# Patient Record
Sex: Female | Born: 1955 | Race: White | Hispanic: No | Marital: Married | Smoking: Never smoker
Health system: Southern US, Community
[De-identification: ages and names within clinical notes are randomized; demographics above are authoritative.]

## PROBLEM LIST (undated history)

## (undated) DIAGNOSIS — M1990 Unspecified osteoarthritis, unspecified site: Secondary | ICD-10-CM

## (undated) DIAGNOSIS — F329 Major depressive disorder, single episode, unspecified: Secondary | ICD-10-CM

## (undated) DIAGNOSIS — M797 Fibromyalgia: Secondary | ICD-10-CM

## (undated) DIAGNOSIS — F32A Depression, unspecified: Secondary | ICD-10-CM

## (undated) HISTORY — DX: Depression, unspecified: F32.A

## (undated) HISTORY — PX: BLADDER SURGERY: SHX569

## (undated) HISTORY — DX: Major depressive disorder, single episode, unspecified: F32.9

## (undated) HISTORY — PX: CHOLECYSTECTOMY: SHX55

## (undated) HISTORY — DX: Unspecified osteoarthritis, unspecified site: M19.90

## (undated) HISTORY — PX: ABDOMINAL HYSTERECTOMY: SHX81

## (undated) HISTORY — DX: Fibromyalgia: M79.7

## (undated) HISTORY — PX: OTHER SURGICAL HISTORY: SHX169

---

## 2012-11-20 DIAGNOSIS — M797 Fibromyalgia: Secondary | ICD-10-CM | POA: Insufficient documentation

## 2013-08-23 DIAGNOSIS — N3281 Overactive bladder: Secondary | ICD-10-CM | POA: Insufficient documentation

## 2013-08-29 DIAGNOSIS — D696 Thrombocytopenia, unspecified: Secondary | ICD-10-CM | POA: Insufficient documentation

## 2014-09-02 DIAGNOSIS — N390 Urinary tract infection, site not specified: Secondary | ICD-10-CM | POA: Insufficient documentation

## 2014-09-02 DIAGNOSIS — N952 Postmenopausal atrophic vaginitis: Secondary | ICD-10-CM | POA: Insufficient documentation

## 2015-10-21 DIAGNOSIS — F329 Major depressive disorder, single episode, unspecified: Secondary | ICD-10-CM | POA: Insufficient documentation

## 2015-10-21 DIAGNOSIS — F32A Depression, unspecified: Secondary | ICD-10-CM | POA: Insufficient documentation

## 2016-10-11 ENCOUNTER — Encounter: Payer: Self-pay | Admitting: Pain Medicine

## 2016-10-11 ENCOUNTER — Encounter (INDEPENDENT_AMBULATORY_CARE_PROVIDER_SITE_OTHER): Payer: Self-pay

## 2016-10-11 ENCOUNTER — Ambulatory Visit: Attending: Pain Medicine | Admitting: Pain Medicine

## 2016-10-11 VITALS — BP 124/77 | HR 88 | Temp 97.7°F | Resp 16 | Ht 67.0 in | Wt 171.0 lb

## 2016-10-11 DIAGNOSIS — M79605 Pain in left leg: Secondary | ICD-10-CM | POA: Diagnosis present

## 2016-10-11 DIAGNOSIS — F329 Major depressive disorder, single episode, unspecified: Secondary | ICD-10-CM | POA: Diagnosis not present

## 2016-10-11 DIAGNOSIS — G8929 Other chronic pain: Secondary | ICD-10-CM | POA: Diagnosis not present

## 2016-10-11 DIAGNOSIS — M5441 Lumbago with sciatica, right side: Secondary | ICD-10-CM | POA: Diagnosis not present

## 2016-10-11 DIAGNOSIS — Z79891 Long term (current) use of opiate analgesic: Secondary | ICD-10-CM | POA: Insufficient documentation

## 2016-10-11 DIAGNOSIS — F1190 Opioid use, unspecified, uncomplicated: Secondary | ICD-10-CM | POA: Diagnosis not present

## 2016-10-11 DIAGNOSIS — M5442 Lumbago with sciatica, left side: Secondary | ICD-10-CM | POA: Diagnosis not present

## 2016-10-11 DIAGNOSIS — M25552 Pain in left hip: Secondary | ICD-10-CM | POA: Diagnosis not present

## 2016-10-11 DIAGNOSIS — M79601 Pain in right arm: Secondary | ICD-10-CM | POA: Diagnosis not present

## 2016-10-11 DIAGNOSIS — M25511 Pain in right shoulder: Secondary | ICD-10-CM

## 2016-10-11 DIAGNOSIS — F419 Anxiety disorder, unspecified: Secondary | ICD-10-CM | POA: Insufficient documentation

## 2016-10-11 DIAGNOSIS — M79604 Pain in right leg: Secondary | ICD-10-CM | POA: Diagnosis present

## 2016-10-11 DIAGNOSIS — M797 Fibromyalgia: Secondary | ICD-10-CM | POA: Insufficient documentation

## 2016-10-11 DIAGNOSIS — R5383 Other fatigue: Secondary | ICD-10-CM | POA: Diagnosis not present

## 2016-10-11 DIAGNOSIS — M79602 Pain in left arm: Secondary | ICD-10-CM | POA: Diagnosis not present

## 2016-10-11 DIAGNOSIS — M533 Sacrococcygeal disorders, not elsewhere classified: Secondary | ICD-10-CM | POA: Insufficient documentation

## 2016-10-11 DIAGNOSIS — M25559 Pain in unspecified hip: Secondary | ICD-10-CM | POA: Diagnosis not present

## 2016-10-11 DIAGNOSIS — M47816 Spondylosis without myelopathy or radiculopathy, lumbar region: Secondary | ICD-10-CM

## 2016-10-11 DIAGNOSIS — G894 Chronic pain syndrome: Secondary | ICD-10-CM

## 2016-10-11 DIAGNOSIS — M4696 Unspecified inflammatory spondylopathy, lumbar region: Secondary | ICD-10-CM | POA: Diagnosis not present

## 2016-10-11 DIAGNOSIS — M25512 Pain in left shoulder: Secondary | ICD-10-CM

## 2016-10-11 DIAGNOSIS — M25551 Pain in right hip: Secondary | ICD-10-CM | POA: Insufficient documentation

## 2016-10-11 DIAGNOSIS — M545 Low back pain: Secondary | ICD-10-CM | POA: Insufficient documentation

## 2016-10-11 NOTE — Progress Notes (Signed)
Patient's Name: Sarah Morales  MRN: [id number]  Referring Provider: Radene Journey, MD  DOB: 1955-10-25  PCP: Radene Journey, MD  DOS: 10/11/2016  Note by: Kathlen Brunswick. Dossie Arbour, MD  Service setting: Ambulatory outpatient  Specialty: Interventional Pain Management  Location: [facility] (AMB) Pain Management Facility    Patient type: New Patient   Primary Reason(s) for Visit: Initial Patient Evaluation CC: Arm Pain (bilaterally); Leg Pain (bilaterally buttocks /down leg); and Fibromyalgia  HPI  Ms. Sarah Morales is a 61 y.o. year old, female patient, who comes today for an initial evaluation. She has Chronic pain syndrome; Long term (current) use of opiate analgesic; Long term prescription opiate use; Opiate use; Chronic upper extremity pain (Location of Primary Source of Pain) (Bilateral) (L>R); Chronic low back pain (Location of Secondary source of pain) (Bilateral) (R>L); Chronic shoulder pain (Location of Tertiary source of pain) (Bilateral) (R>L); Chronic sacroiliac joint pain (Right); Chronic hip pain (Bilateral) (R>L); and Lumbar facet syndrome (Bilateral) (R>L) on her problem list.. Her primarily concern today is the Arm Pain (bilaterally); Leg Pain (bilaterally buttocks /down leg); and Fibromyalgia  Pain Assessment: Self-Reported Pain Score: 2 /10             Reported level is compatible with observation.       Pain Type: Chronic pain Pain Location: Arm Pain Orientation: Right, Left Pain Descriptors / Indicators: Throbbing Pain Frequency: Constant  Onset and Duration: Date of onset: 11/17 Cause of pain: fibromyalgia Severity: No change since onset, NAS-11 at its worse: 5/10 and NAS-11 at its best: 1/10 Timing: After activity or exercise Aggravating Factors: Prolonged sitting, Prolonged standing, Walking and Working Alleviating Factors: Medications, Resting, Sleeping, Warm showers or baths, Walking and working Associated Problems: Depression, Fatigue, Inability to concentrate, Inability to control  bladder (urine), Personality changes, Temperature changes and Pain that does not allow patient to sleep Quality of Pain: Aching, Tender and Throbbing Previous Examinations or Tests: Bone scan and Chiropractic evaluation Previous Treatments: Chiropractic manipulations, Epidural steroid injections, Physical Therapy, Pool exercises, TENS and Trigger point injections  The patient comes into the clinics today for the first time for a chronic pain management evaluation. According to the patient primary area of pain is that of the upper extremities with the left being worst on the right. The patient indicates being right-handed. In the case of the left upper extremity, patient denies any surgeries, nerve blocks, joint injections, with the exception of an injection into her middle finger approximately 3 years ago. This was done by a [facility] rheumatologist. The patient denies any physical therapy or any recent x-rays. In the case of the right upper extremity the patient denies any prior surgeries, nerve blocks, joint injections, physical therapy, or x-rays. The next area of pain is described to be that of the lower back with the right side being worst on the left. The patient indicates having had surgery of the lower back around the year 2011 by Dr. Burman Riis (orthopedic surgeon) she also describes having had one nerve block done before the surgery at a pain clinic in [place]. She indicates that that particular nerve block did not help her pain. Prior to surgery the patient indicates that she was experiencing low back pain and lower extremity pain with the lower back pain being worst on the leg pain. In the case of the leg pain at that time the right side was worse than the left. In the case of the right leg pain would travel from the hip to the knee  through the lateral aspect of the leg. In the case of the left lower extremity, the pain also travels from the hip to the knee through the lateral aspect of the leg  but he never went down into her feet she describes that the pain completely went away after the surgery. The next area of pain is described to be the shoulders with the right side being worst on the left. In the case of the right shoulder she denies any prior surgeries but does admit to having had a joint injection done approximately 1 year ago at [facility]. She indicates that this injection did provide her with some benefit that was short lived. She denies any prior physical therapy or x-rays of the area. In the case of the left shoulder she denies any prior surgeries, nerve blocks, joint injections, physical therapy, or x-rays. The next area of pain is that of the SI joint with the right side being worst on the left. She indicates having had some nerve blocks done approximately 12-15 years ago by a rheumatologist. She indicates that the nerve blocks did help her pain for several weeks. She denies any surgeries but does admit to some physical therapy approximately 12-15 years ago consisting primarily on pool therapy. She denies any recent x-rays.  Physical exam today was positive for bilateral hip pain with the right side being worse than the left. Physical therapy was also positive for right-sided sacroiliac joint pain and bilateral lumbar facet pain with the right being worst on the left.  Today I took the time to provide the patient with information regarding my pain practice. The patient was informed that my practice is divided into two sections: an interventional pain management section, as well as a completely separate and distinct medication management section. I explained that I have procedure days for my interventional therapies, and evaluation days for follow-ups and medication management. Because of the amount of documentation required during both, they are kept separated. This means that there is the possibility that she may be scheduled for a procedure on one day, and medication  management the next. I have also informed her that because of staffing and facility limitations, I no longer take patients for medication management only. To illustrate the reasons for this, I gave the patient the example of surgeons, and how inappropriate it would be to refer a patient to his/her care, just to write for the post-surgical antibiotics on a surgery done by a different surgeon.   Because interventional pain management is my board-certified specialty, the patient was informed that joining my practice means that they are open to any and all interventional therapies. I made it clear that this does not mean that they will be forced to have any procedures done. What this means is that I believe interventional therapies to be essential part of the diagnosis and proper management of chronic pain conditions. Therefore, patients not interested in these interventional alternatives will be better served under the care of a different practitioner.  The patient was also made aware of my Comprehensive Pain Management Safety Guidelines where by joining my practice, they limit all of their nerve blocks and joint injections to those done by our practice, for as long as we are retained to manage their care.   Historic Controlled Substance Pharmacotherapy Review  PMP and historical list of controlled substances: Tramadol 50 mg; clonazepam 1 mg; Soma 250 mg; oxycodone/APAP 5/325; hydrocodone/APAP 10/325; hydrocodone cough suspension; Soma 350 mg; phentermine 37.5  mg. Highest analgesic regimen found: Oxycodone/APAP 5/325 one tablet by mouth every 4 hours (30 mg/day of oxycodone) (45 MME/Day) Most recent analgesic: Tramadol 50 mg 1 tablet by mouth twice a day Highest recorded MME/day: 45 mg/day MME/day: 10 mg/day Medications: The patient did not bring the medication(s) to the appointment, as requested in our "New Patient Package" Pharmacodynamics: Desired effects: Analgesia: The patient reports >50%  benefit. Reported improvement in function: The patient reports medication allows her to accomplish basic ADLs. Clinically meaningful improvement in function (CMIF): Sustained CMIF goals met Perceived effectiveness: Described as relatively effective, allowing for increase in activities of daily living (ADL) Undesirable effects: Side-effects or Adverse reactions: None reported Historical Monitoring: The patient  reports that she does not use drugs.. List of all UDS Test(s): No results found for: MDMA, COCAINSCRNUR, PCPSCRNUR, PCPQUANT, CANNABQUANT, THCU, [place] List of all Serum Drug Screening Test(s):  No results found for: AMPHSCRSER, BARBSCRSER, BENZOSCRSER, COCAINSCRSER, PCPSCRSER, PCPQUANT, THCSCRSER, CANNABQUANT, OPIATESCRSER, OXYSCRSER, PROPOXSCRSER Historical Background Evaluation: [place] PDMP: Six (6) year initial data search conducted.             [place] Department of public safety, offender search: Editor, commissioning Information) Non-contributory Risk Assessment Profile: Aberrant behavior: None observed or detected today Risk factors for fatal opioid overdose: None identified today Fatal overdose hazard ratio (HR): Calculation deferred Non-fatal overdose hazard ratio (HR): Calculation deferred Risk of opioid abuse or dependence: 0.7-3.0% with doses ? 36 MME/day and 6.1-26% with doses ? 120 MME/day. Substance use disorder (SUD) risk level: Pending results of Medical Psychology Evaluation for SUD Opioid risk tool (ORT) (Total Score): 1  ORT Scoring interpretation table:  Score <3 = Low Risk for SUD  Score between 4-7 = Moderate Risk for SUD  Score >8 = High Risk for Opioid Abuse   PHQ-2 Depression Scale:  Total score: 0  PHQ-2 Scoring interpretation table: (Score and probability of major depressive disorder)  Score 0 = No depression  Score 1 = 15.4% Probability  Score 2 = 21.1% Probability  Score 3 = 38.4% Probability  Score 4 = 45.5% Probability  Score 5 = 56.4% Probability  Score 6 = 78.6%  Probability   PHQ-9 Depression Scale:  Total score: 0  PHQ-9 Scoring interpretation table:  Score 0-4 = No depression  Score 5-9 = Mild depression  Score 10-14 = Moderate depression  Score 15-19 = Moderately severe depression  Score 20-27 = Severe depression (2.4 times higher risk of SUD and 2.89 times higher risk of overuse)   Pharmacologic Plan: Pending ordered tests and/or consults  Meds  The patient has a current medication list which includes the following prescription(s): celecoxib, duloxetine, duloxetine, gralise, and tramadol.  No current outpatient prescriptions on file prior to visit.   No current facility-administered medications on file prior to visit.    Imaging Review   Note: No results found under the [facility] record.        ROS  Cardiovascular History: Negative for hypertension, coronary artery diseas, myocardial infraction, anticoagulant therapy or heart failure Pulmonary or Respiratory History: Snoring  Neurological History: Negative for epilepsy, stroke, urinary or fecal inontinence, spina bifida or tethered cord syndrome Review of Past Neurological Studies: No results found for this or any previous visit. Psychological-Psychiatric History: Anxiety, Depression and Panic Attacks Gastrointestinal History: Reflux or heatburn Genitourinary History: Recurrent Urinary Tract infections Hematological History: Thrombocytopenia Endocrine History: Negative for diabetes or thyroid disease Rheumatologic History: Fibromyalgia Musculoskeletal History: Negative for myasthenia gravis, muscular dystrophy, multiple sclerosis or malignant  hyperthermia Work History: Retired  Allergies  Ms. Langelier is allergic to penicillins.  Laboratory Chemistry   Note: No results found under the [facility] electronic medical record  [facility]  Drug: Ms. Pollick  reports that she does not use drugs. Alcohol:  reports that she drinks alcohol. Tobacco:  reports  that she has never smoked. She has never used smokeless tobacco. Medical:  has a past medical history of Depression and Fibromyalgia. Family: family history is not on file.  Past Surgical History:  Procedure Laterality Date  . ABDOMINAL HYSTERECTOMY    . BLADDER SURGERY    . CHOLECYSTECTOMY    . prolapse colon     Active Ambulatory Problems    Diagnosis Date Noted  . Chronic pain syndrome 10/11/2016  . Long term (current) use of opiate analgesic 10/11/2016  . Long term prescription opiate use 10/11/2016  . Opiate use 10/11/2016  . Chronic upper extremity pain (Location of Primary Source of Pain) (Bilateral) (L>R) 10/11/2016  . Chronic low back pain (Location of Secondary source of pain) (Bilateral) (R>L) 10/11/2016  . Chronic shoulder pain (Location of Tertiary source of pain) (Bilateral) (R>L) 10/11/2016  . Chronic sacroiliac joint pain (Right) 10/11/2016  . Chronic hip pain (Bilateral) (R>L) 10/11/2016  . Lumbar facet syndrome (Bilateral) (R>L) 10/11/2016   Resolved Ambulatory Problems    Diagnosis Date Noted  . No Resolved Ambulatory Problems   Past Medical History:  Diagnosis Date  . Depression   . Fibromyalgia    Constitutional Exam  General appearance: Well nourished, well developed, and well hydrated. In no apparent acute distress Vitals:   10/11/16 1329  BP: 124/77  Pulse: 88  Resp: 16  Temp: 97.7 F (36.5 C)  SpO2: 100%  Weight: 171 lb (77.6 kg)  Height: 5' 7" (1.702 m)   BMI Assessment: Estimated body mass index is 26.78 kg/m as calculated from the following:   Height as of this encounter: 5' 7" (1.702 m).   Weight as of this encounter: 171 lb (77.6 kg).  BMI interpretation table: BMI level Category Range association with higher incidence of chronic pain  <18 kg/m2 Underweight   18.5-24.9 kg/m2 Ideal body weight   25-29.9 kg/m2 Overweight Increased incidence by 20%  30-34.9 kg/m2 Obese (Class I) Increased incidence by 68%  35-39.9 kg/m2 Severe  obesity (Class II) Increased incidence by 136%  >40 kg/m2 Extreme obesity (Class III) Increased incidence by 254%   BMI Readings from Last 4 Encounters:  10/11/16 26.78 kg/m   Wt Readings from Last 4 Encounters:  10/11/16 171 lb (77.6 kg)  Psych/Mental status: Alert, oriented x 3 (person, place, & time)       Eyes: PERLA Respiratory: No evidence of acute respiratory distress  Cervical Spine Exam  Inspection: No masses, redness, or swelling Alignment: Symmetrical Functional ROM: Unrestricted ROM      Stability: No instability detected Muscle strength & Tone: Functionally intact Sensory: Unimpaired Palpation: No palpable anomalies              Upper Extremity (UE) Exam    Side: Right upper extremity  Side: Left upper extremity  Inspection: No masses, redness, swelling, or asymmetry. No contractures  Inspection: No masses, redness, swelling, or asymmetry. No contractures  Functional ROM: Unrestricted ROM          Functional ROM: Unrestricted ROM          Muscle strength & Tone: Functionally intact  Muscle strength & Tone: Functionally intact  Sensory: Unimpaired  Sensory: Unimpaired  Palpation: No palpable anomalies              Palpation: No palpable anomalies              Specialized Test(s): Deferred         Specialized Test(s): Deferred          Thoracic Spine Exam  Inspection: No masses, redness, or swelling Alignment: Symmetrical Functional ROM: Unrestricted ROM Stability: No instability detected Sensory: Unimpaired Muscle strength & Tone: No palpable anomalies  Lumbar Spine Exam  Inspection: No masses, redness, or swelling Alignment: Symmetrical Functional ROM: Decreased ROM, bilaterally Stability: No instability detected Muscle strength & Tone: Functionally intact Sensory: Movement-associated pain Palpation: Complains of area being tender to palpation       Provocative Tests: Lumbar Hyperextension and rotation test: Positive bilaterally for facet joint  pain. Patrick's Maneuver: Positive for right-sided S-I arthralgia and for bilateral hip arthralgia  Gait & Posture Assessment  Ambulation: Unassisted Gait: Relatively normal for age and body habitus Posture: WNL   Lower Extremity Exam    Side: Right lower extremity  Side: Left lower extremity  Inspection: No masses, redness, swelling, or asymmetry. No contractures  Inspection: No masses, redness, swelling, or asymmetry. No contractures  Functional ROM: Unrestricted ROM          Functional ROM: Unrestricted ROM          Muscle strength & Tone: Able to Toe-walk & Heel-walk without problems  Muscle strength & Tone: Able to Toe-walk & Heel-walk without problems  Sensory: Unimpaired  Sensory: Unimpaired  Palpation: No palpable anomalies  Palpation: No palpable anomalies   Assessment  Primary Diagnosis & Pertinent Problem List: The primary encounter diagnosis was Chronic pain syndrome. Diagnoses of Chronic low back pain (Location of Secondary source of pain) (Bilateral) (R>L), Lumbar facet syndrome (Bilateral) (R>L), Chronic sacroiliac joint pain (Bilateral) (R>L), Chronic hip pain (Bilateral) (R>L), Chronic shoulder pain (Location of Tertiary source of pain) (Bilateral) (R>L), Chronic pain of both upper extremities, Long term (current) use of opiate analgesic, Long term prescription opiate use, and Opiate use were also pertinent to this visit.  Visit Diagnosis: 1. Chronic pain syndrome   2. Chronic low back pain (Location of Secondary source of pain) (Bilateral) (R>L)   3. Lumbar facet syndrome (Bilateral) (R>L)   4. Chronic sacroiliac joint pain (Bilateral) (R>L)   5. Chronic hip pain (Bilateral) (R>L)   6. Chronic shoulder pain (Location of Tertiary source of pain) (Bilateral) (R>L)   7. Chronic pain of both upper extremities   8. Long term (current) use of opiate analgesic   9. Long term prescription opiate use   10. Opiate use    Plan of Care  Initial treatment plan:  Please be  advised that as per protocol, today's visit has been an evaluation only. We have not taken over the patient's controlled substance management.  Problem-specific plan: No problem-specific Assessment & Plan notes found for this encounter.  Ordered Lab-work, Procedure(s), Referral(s), & Consult(s): Orders Placed This Encounter  Procedures  . DG Lumbar Spine Complete W/Bend  . DG Si Joints  . DG HIP UNILAT W OR W/O PELVIS 2-3 VIEWS LEFT  . DG HIP UNILAT W OR W/O PELVIS 2-3 VIEWS RIGHT  . DG Shoulder Left  . DG Shoulder Right  . Compliance Drug Analysis, Ur  . Comprehensive metabolic panel  . C-reactive protein  . Magnesium  . Sedimentation rate  . Vitamin B12  . 25-Hydroxyvitamin D Lcms D2+D3  . Ambulatory  referral to Psychology   Pharmacotherapy: Medications ordered:  No orders of the defined types were placed in this encounter.  Medications administered during this visit: Ms. Kathman had no medications administered during this visit.   Pharmacotherapy under consideration:  Opioid Analgesics: The patient was informed that there is no guarantee that she would be a candidate for opioid analgesics. The decision will be made following CDC guidelines. This decision will be based on the results of diagnostic studies, as well as Ms. Blomberg's risk profile.  Membrane stabilizer: To be determined at a later time Muscle relaxant: To be determined at a later time NSAID: To be determined at a later time Other analgesic(s): To be determined at a later time   Interventional therapies under consideration: Ms. Westbrook was informed that there is no guarantee that she would be a candidate for interventional therapies. The decision will be based on the results of diagnostic studies, as well as Ms. Interrante's risk profile.  Possible procedure(s): Possible diagnostic left-sided cervical epidural steroid injection  Diagnostic bilateral lumbar facet block  Possible bilateral lumbar facet RFA  Diagnostic  right-sided intra-articular sacroiliac joint injection  Possible right SI joint RFA  Diagnostic bilateral intra-articular hip joint injection  Possible bilateral femoral nerve and obturator nerve block  Possible bilateral femoral nerve and obturator nerve RFA    Provider-requested follow-up: Return for 2nd Visit, after Med[employer] eval.  No future appointments.  Primary Care Physician: Radene Journey, MD Location: [facility] Note by: Kathlen Brunswick. Dossie Arbour, M.D, DABA, DABAPM, DABPM, DABIPP, FIPP Date: 10/11/2016; Time: 2:43 PM  Patient instructions provided during this appointment: There are no Patient Instructions on file for this visit.

## 2016-10-16 LAB — COMPLIANCE DRUG ANALYSIS, UR

## 2016-10-25 ENCOUNTER — Ambulatory Visit
Admission: RE | Admit: 2016-10-25 | Discharge: 2016-10-25 | Disposition: A | Discharge status: Home / Self Care | Source: Ambulatory Visit | Attending: Pain Medicine | Admitting: Pain Medicine

## 2016-10-25 ENCOUNTER — Other Ambulatory Visit
Admission: RE | Admit: 2016-10-25 | Discharge: 2016-10-25 | Disposition: A | Discharge status: Home / Self Care | Source: Ambulatory Visit | Attending: Pain Medicine | Admitting: Pain Medicine

## 2016-10-25 DIAGNOSIS — M25512 Pain in left shoulder: Secondary | ICD-10-CM | POA: Diagnosis not present

## 2016-10-25 DIAGNOSIS — M5442 Lumbago with sciatica, left side: Secondary | ICD-10-CM | POA: Diagnosis not present

## 2016-10-25 DIAGNOSIS — M5136 Other intervertebral disc degeneration, lumbar region: Secondary | ICD-10-CM | POA: Diagnosis not present

## 2016-10-25 DIAGNOSIS — M4696 Unspecified inflammatory spondylopathy, lumbar region: Secondary | ICD-10-CM | POA: Insufficient documentation

## 2016-10-25 DIAGNOSIS — M19011 Primary osteoarthritis, right shoulder: Secondary | ICD-10-CM | POA: Insufficient documentation

## 2016-10-25 DIAGNOSIS — M25511 Pain in right shoulder: Secondary | ICD-10-CM

## 2016-10-25 DIAGNOSIS — M533 Sacrococcygeal disorders, not elsewhere classified: Secondary | ICD-10-CM | POA: Insufficient documentation

## 2016-10-25 DIAGNOSIS — M25552 Pain in left hip: Secondary | ICD-10-CM | POA: Diagnosis not present

## 2016-10-25 DIAGNOSIS — G8929 Other chronic pain: Secondary | ICD-10-CM | POA: Insufficient documentation

## 2016-10-25 DIAGNOSIS — M47816 Spondylosis without myelopathy or radiculopathy, lumbar region: Secondary | ICD-10-CM

## 2016-10-25 DIAGNOSIS — M25551 Pain in right hip: Secondary | ICD-10-CM | POA: Diagnosis not present

## 2016-10-25 DIAGNOSIS — M25559 Pain in unspecified hip: Secondary | ICD-10-CM

## 2016-10-25 DIAGNOSIS — M19012 Primary osteoarthritis, left shoulder: Secondary | ICD-10-CM | POA: Diagnosis not present

## 2016-10-25 DIAGNOSIS — M5441 Lumbago with sciatica, right side: Secondary | ICD-10-CM | POA: Insufficient documentation

## 2016-10-25 DIAGNOSIS — G894 Chronic pain syndrome: Secondary | ICD-10-CM

## 2016-10-25 LAB — COMPREHENSIVE METABOLIC PANEL
ALBUMIN: 4.4 g/dL (ref 3.5–5.0)
ALT: 20 U/L (ref 14–54)
AST: 23 U/L (ref 15–41)
Alkaline Phosphatase: 89 U/L (ref 38–126)
Anion gap: 8 (ref 5–15)
BUN: 20 mg/dL (ref 6–20)
CHLORIDE: 101 mmol/L (ref 101–111)
CO2: 29 mmol/L (ref 22–32)
Calcium: 9.3 mg/dL (ref 8.9–10.3)
Creatinine, Ser: 0.69 mg/dL (ref 0.44–1.00)
GFR calc Af Amer: >60 mL/min (ref >60)
Glucose, Bld: 96 mg/dL (ref 65–99)
POTASSIUM: 4.1 mmol/L (ref 3.5–5.1)
SODIUM: 138 mmol/L (ref 135–145)
Total Bilirubin: 0.6 mg/dL (ref 0.3–1.2)
Total Protein: 7 g/dL (ref 6.5–8.1)

## 2016-10-25 LAB — SEDIMENTATION RATE: Sed Rate: 2 mm/hr (ref 0–30)

## 2016-10-25 LAB — MAGNESIUM: Magnesium: 2 mg/dL (ref 1.7–2.4)

## 2016-10-25 LAB — VITAMIN B12: VITAMIN B 12: 318 pg/mL (ref 180–914)

## 2016-10-25 LAB — C-REACTIVE PROTEIN

## 2016-10-25 NOTE — Progress Notes (Signed)
Results were reviewed and found to be: mildly abnormal  No acute injury or pathology identified  Review would suggest interventional pain management techniques may be of benefit 

## 2016-10-26 NOTE — Progress Notes (Signed)
Results were reviewed and found to be: mildly abnormal  No acute injury or pathology identified  Review would suggest interventional pain management techniques may be of benefit 

## 2016-10-29 LAB — 25-HYDROXY VITAMIN D LCMS D2+D3
25-Hydroxy, Vitamin D-2: 2 ng/mL
25-Hydroxy, Vitamin D-3: 36 ng/mL
25-Hydroxy, Vitamin D: 38 ng/mL

## 2016-11-14 NOTE — Progress Notes (Signed)
Patient's Name: Sarah Morales  MRN: [id number]  Referring Provider: Radene Journey, MD  DOB: Jul 26, 1955  PCP: Radene Journey, MD  DOS: 11/15/2016  Note by: Kathlen Brunswick. Dossie Arbour, MD  Service setting: Ambulatory outpatient  Specialty: Interventional Pain Management  Location: [facility] (AMB) Pain Management Facility    Patient type: Established   Primary Reason(s) for Visit: Encounter for evaluation before starting new chronic pain management plan of care (Level of risk: moderate) CC: Arm Pain (fibromyalgia pain)  HPI  Sarah Morales is a 61 y.o. year old, female patient, who comes today for a follow-up evaluation to review the test results and decide on a treatment plan. She has Chronic pain syndrome; Long term (current) use of opiate analgesic; Long term prescription opiate use; Opiate use; Chronic upper extremity pain (Location of Primary Source of Pain) (Bilateral) (L>R); Chronic low back pain (Location of Secondary source of pain) (Bilateral) (R>L); Chronic shoulder pain (Location of Tertiary source of pain) (Bilateral) (R>L); Chronic sacroiliac joint pain (Right); Chronic hip pain (Bilateral) (R>L); Lumbar facet syndrome (Bilateral) (R>L); Depression; Fibromyalgia; OAB (overactive bladder); Recurrent UTI; Thrombocytopenia ([place]); Vaginal atrophy; and 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 Her primarily concern today is the Arm Pain (fibromyalgia pain)  Pain Assessment: Self-Reported Pain Score: 0-No pain/10             Reported level is compatible with observation.       Pain Type: Chronic pain Pain Orientation: Right, Left Pain Descriptors / Indicators: Aching, Dull, Throbbing Pain Frequency: Intermittent  Sarah Morales comes in today for a follow-up visit after her initial evaluation on 10/11/2016. Today we went over the results of her tests. These were explained in "Layman's terms". During today's appointment we went over my diagnostic impression, as well as the proposed treatment  plan.  In considering the treatment plan options, Ms. Palazzola was reminded that I no longer take patients for medication management only. I asked her to let me know if she had no intention of taking advantage of the interventional therapies, so that we could make arrangements to provide this space to someone interested. I also made it clear that undergoing interventional therapies for the purpose of getting pain medications is very inappropriate on the part of a patient, and it will not be tolerated in this practice. This type of behavior would suggest true addiction and therefore it requires referral to an addiction specialist.   Further details on both, my assessment(s), as well as the proposed treatment plan, please see below. Controlled Substance Pharmacotherapy Assessment REMS (Risk Evaluation and Mitigation Strategy)  Analgesic: Tramadol 50 mg 1 tablet by mouth twice a day Highest recorded MME/day: 45 mg/day MME/day: 10 mg/day Pill Count: None expected due to no prior prescriptions written by our practice. Pharmacokinetics: Liberation and absorption (onset of action): WNL Distribution (time to peak effect): WNL Metabolism and excretion (duration of action): WNL         Pharmacodynamics: Desired effects: Analgesia: Sarah Morales reports >50% benefit. Functional ability: Patient reports that medication allows her to accomplish basic ADLs Clinically meaningful improvement in function (CMIF): Sustained CMIF goals met Perceived effectiveness: Described as relatively effective, allowing for increase in activities of daily living (ADL) Undesirable effects: Side-effects or Adverse reactions: None reported Monitoring: [place] PMP: Online review of the past 83-monthperiod previously conducted. Not applicable at this point since we have not taken over the patient's medication management yet. List of all Serum Drug Screening Test(s):  No results found for: AMPHSCRSER, BARBSCRSER,  BENZOSCRSER,  COCAINSCRSER, PCPSCRSER, THCSCRSER, OPIATESCRSER, [place], [employer] List of all UDS test(s) done:  Lab Results  Component Value Date   SUMMARY FINAL 10/11/2016   Last UDS on record: Summary  Date Value Ref Range Status  10/11/2016 FINAL  Final    Comment:    ==================================================================== TOXASSURE COMP DRUG ANALYSIS,UR ==================================================================== Test                             Result       Flag       Units Drug Present and Declared for Prescription Verification   Tramadol                       PRESENT      EXPECTED   O-Desmethyltramadol            PRESENT      EXPECTED   N-Desmethyltramadol            PRESENT      EXPECTED    Source of tramadol is a prescription medication.    O-desmethyltramadol and N-desmethyltramadol are expected    metabolites of tramadol.   Gabapentin                     PRESENT      EXPECTED   Duloxetine                     PRESENT      EXPECTED Drug Present not Declared for Prescription Verification   Diphenhydramine                PRESENT      UNEXPECTED ==================================================================== Test                      Result    Flag   Units      Ref Range   Creatinine              32               mg/dL      >=20 ==================================================================== Declared Medications:  The flagging and interpretation on this report are based on the  following declared medications.  Unexpected results may arise from  inaccuracies in the declared medications.  **Note: The testing scope of this panel includes these medications:  Duloxetine (Cymbalta)  Gabapentin (Gralise)  Tramadol  **Note: The testing scope of this panel does not include following  reported medications:  Celecoxib (Celebrex) ==================================================================== For clinical consultation, please call [phone]. ====================================================================    UDS interpretation: No unexpected findings.          Medication Assessment Form: Patient introduced to form today Treatment compliance: Treatment may start today if patient agrees with proposed plan. Evaluation of compliance is not applicable at this point Risk Assessment Profile: Aberrant behavior: See initial evaluations. None observed or detected today Comorbid factors increasing risk of overdose: See initial evaluation. No additional risks detected today Risk Mitigation Strategies:  Patient opioid safety counseling: Completed today. Counseling provided to patient as per "Patient Counseling Document". Document signed by patient, attesting to counseling and understanding Patient-Prescriber Agreement (PPA): Obtained today  Controlled substance notification to other providers: Written and sent today  Pharmacologic Plan: Today we may be taking over the patient's pharmacological regimen. See below  Laboratory Chemistry  Inflammation Markers Lab Results  Component Value Date   CRP <0.8  10/25/2016   ESRSEDRATE 2 10/25/2016   (CRP: Acute Phase) (ESR: Chronic Phase) Renal Function Markers Lab Results  Component Value Date   BUN 20 10/25/2016   CREATININE 0.69 10/25/2016   GFRAA >60 10/25/2016   GFRNONAA >60 10/25/2016   Hepatic Function Markers Lab Results  Component Value Date   AST 23 10/25/2016   ALT 20 10/25/2016   ALBUMIN 4.4 10/25/2016   ALKPHOS 89 10/25/2016   Electrolytes Lab Results  Component Value Date   NA 138 10/25/2016   K 4.1 10/25/2016   CL 101 10/25/2016   CALCIUM 9.3 10/25/2016   MG 2.0 10/25/2016   Neuropathy Markers Lab Results  Component Value Date   VITAMINB12 318 10/25/2016   Bone Pathology Markers Lab Results  Component Value Date   ALKPHOS 89 10/25/2016   25OHVITD1 38 10/25/2016   25OHVITD2 2.0 10/25/2016   25OHVITD3 36 10/25/2016   CALCIUM 9.3 10/25/2016    Coagulation Parameters No results found for: INR, LABPROT, APTT, PLT Cardiovascular Markers No results found for: BNP, HGB, HCT Note: Lab results reviewed.  Recent Diagnostic Imaging Review  Dg Lumbar Spine Complete W/bend Result Date: 10/25/2016 CLINICAL DATA:  Chronic low back pain EXAM: LUMBAR SPINE - COMPLETE WITH BENDING VIEWS COMPARISON:  None FINDINGS: Changes of prior posterior fusion at L4-5. No hardware complicating feature. Mild leftward scoliosis in the mid lumbar spine. Degenerative disc disease changes, most pronounced at L3-4 with sclerotic endplates and spurring. No fracture. SI joints are symmetric and unremarkable. No instability with flexion or extension. IMPRESSION: Degenerative and postoperative changes in the lumbar spine. No acute findings. Electronically Signed   By: Rolm Baptise M.D.   On: 10/25/2016 15:33   Dg Si Joints Result Date: 10/25/2016 CLINICAL DATA:  Chronic SI joint pain EXAM: BILATERAL SACROILIAC JOINTS - 3+ VIEW COMPARISON:  None. FINDINGS: Mild sclerosis around the left SI joint could reflect sacroiliitis. Right SI joint is unremarkable. No acute bony abnormality. IMPRESSION: Mild sclerosis around the left SI joint may reflect mild sacroiliitis. Electronically Signed   By: Rolm Baptise M.D.   On: 10/25/2016 15:33   Dg Shoulder Right Result Date: 10/25/2016 CLINICAL DATA:  Chronic bilateral shoulder pain. EXAM: RIGHT SHOULDER - 2+ VIEW COMPARISON:  None in PACs FINDINGS: The bones are subjectively adequately mineralized. The glenohumeral joint space is well maintained. Small spurs arise from the inferior articular margin of the humeral head and the adjacent inferior glenoid lip. The subacromial subdeltoid space is normal. The AC joint space is reasonably well-maintained. There is no acute or old fracture or dislocation. IMPRESSION: Mild osteoarthritic change centered on the glenohumeral joint. No acute bony abnormality. Electronically Signed   By: David  Martinique  M.D.   On: 10/25/2016 15:32   Dg Shoulder Left Result Date: 10/25/2016 CLINICAL DATA:  Chronic bilateral shoulder pain. EXAM: LEFT SHOULDER - 2+ VIEW COMPARISON:  None in PACs FINDINGS: The bones are subjectively adequately mineralized. The glenohumeral joint spaces reasonably well-maintained. A small spur arises from the inferior articular margin of the glenoid. The subacromial subdeltoid space is normal. There is mild narrowing of the AC joint. IMPRESSION: Very mild osteoarthritic change of the left shoulder. No acute bony abnormality. Electronically Signed   By: David  Martinique M.D.   On: 10/25/2016 15:31   Dg Hip Unilat W Or W/o Pelvis 2-3 Views Left Result Date: 10/25/2016 CLINICAL DATA:  Chronic bilateral hip pain EXAM: DG HIP (WITH OR WITHOUT PELVIS) 2-3V LEFT; DG HIP (WITH OR WITHOUT PELVIS) 2-3V RIGHT  COMPARISON:  None in PACs FINDINGS: The bones are subjectively osteopenic. There is no lytic or blastic lesion. No acute or old fracture is observed. The hip joint spaces are well maintained. The articular surfaces of the femoral heads and acetabuli remains smoothly rounded. The femoral necks, intertrochanteric, immediate sub trochanteric regions are normal. IMPRESSION: There is no acute or significant chronic bony abnormality of the hips. Electronically Signed   By: David  Martinique M.D.   On: 10/25/2016 15:30   Dg Hip Unilat W Or W/o Pelvis 2-3 Views Right Result Date: 10/25/2016 CLINICAL DATA:  Chronic bilateral hip pain EXAM: DG HIP (WITH OR WITHOUT PELVIS) 2-3V LEFT; DG HIP (WITH OR WITHOUT PELVIS) 2-3V RIGHT COMPARISON:  None in PACs FINDINGS: The bones are subjectively osteopenic. There is no lytic or blastic lesion. No acute or old fracture is observed. The hip joint spaces are well maintained. The articular surfaces of the femoral heads and acetabuli remains smoothly rounded. The femoral necks, intertrochanteric, immediate sub trochanteric regions are normal. IMPRESSION: There is no acute or  significant chronic bony abnormality of the hips. Electronically Signed   By: David  Martinique M.D.   On: 10/25/2016 15:30   Shoulder Imaging: Shoulder-R DG:  Results for orders placed during the hospital encounter of 10/25/16  DG Shoulder Right   Narrative CLINICAL DATA:  Chronic bilateral shoulder pain.  EXAM: RIGHT SHOULDER - 2+ VIEW  COMPARISON:  None in PACs  FINDINGS: The bones are subjectively adequately mineralized. The glenohumeral joint space is well maintained. Small spurs arise from the inferior articular margin of the humeral head and the adjacent inferior glenoid lip. The subacromial subdeltoid space is normal. The AC joint space is reasonably well-maintained. There is no acute or old fracture or dislocation.  IMPRESSION: Mild osteoarthritic change centered on the glenohumeral joint. No acute bony abnormality.   Electronically Signed   By: David  Martinique M.D.   On: 10/25/2016 15:32    Shoulder-L DG:  Results for orders placed during the hospital encounter of 10/25/16  DG Shoulder Left   Narrative CLINICAL DATA:  Chronic bilateral shoulder pain.  EXAM: LEFT SHOULDER - 2+ VIEW  COMPARISON:  None in PACs  FINDINGS: The bones are subjectively adequately mineralized. The glenohumeral joint spaces reasonably well-maintained. A small spur arises from the inferior articular margin of the glenoid. The subacromial subdeltoid space is normal. There is mild narrowing of the AC joint.  IMPRESSION: Very mild osteoarthritic change of the left shoulder. No acute bony abnormality.   Electronically Signed   By: David  Martinique M.D.   On: 10/25/2016 15:31    Lumbosacral Imaging: Lumbar DG Bending views:  Results for orders placed during the hospital encounter of 10/25/16  DG Lumbar Spine Complete W/Bend   Narrative CLINICAL DATA:  Chronic low back pain  EXAM: LUMBAR SPINE - COMPLETE WITH BENDING VIEWS  COMPARISON:  None  FINDINGS: Changes of prior posterior  fusion at L4-5. No hardware complicating feature. Mild leftward scoliosis in the mid lumbar spine. Degenerative disc disease changes, most pronounced at L3-4 with sclerotic endplates and spurring. No fracture. SI joints are symmetric and unremarkable. No instability with flexion or extension.  IMPRESSION: Degenerative and postoperative changes in the lumbar spine. No acute findings.   Electronically Signed   By: Rolm Baptise M.D.   On: 10/25/2016 15:33    Sacroiliac Joint Imaging: Sacroiliac Joint DG:  Results for orders placed during the hospital encounter of 10/25/16  DG Si Joints   Narrative CLINICAL DATA:  Chronic SI joint pain  EXAM: BILATERAL SACROILIAC JOINTS - 3+ VIEW  COMPARISON:  None.  FINDINGS: Mild sclerosis around the left SI joint could reflect sacroiliitis. Right SI joint is unremarkable. No acute bony abnormality.  IMPRESSION: Mild sclerosis around the left SI joint may reflect mild sacroiliitis.   Electronically Signed   By: Rolm Baptise M.D.   On: 10/25/2016 15:33    Hip Imaging: Hip-R DG 2-3 views:  Results for orders placed during the hospital encounter of 10/25/16  DG HIP UNILAT W OR W/O PELVIS 2-3 VIEWS RIGHT   Narrative CLINICAL DATA:  Chronic bilateral hip pain  EXAM: DG HIP (WITH OR WITHOUT PELVIS) 2-3V LEFT; DG HIP (WITH OR WITHOUT PELVIS) 2-3V RIGHT  COMPARISON:  None in PACs  FINDINGS: The bones are subjectively osteopenic. There is no lytic or blastic lesion. No acute or old fracture is observed. The hip joint spaces are well maintained. The articular surfaces of the femoral heads and acetabuli remains smoothly rounded. The femoral necks, intertrochanteric, immediate sub trochanteric regions are normal.  IMPRESSION: There is no acute or significant chronic bony abnormality of the hips.   Electronically Signed   By: David  Martinique M.D.   On: 10/25/2016 15:30    Hip-L DG 2-3 views:  Results for orders placed during the  hospital encounter of 10/25/16  DG HIP UNILAT W OR W/O PELVIS 2-3 VIEWS LEFT   Narrative CLINICAL DATA:  Chronic bilateral hip pain  EXAM: DG HIP (WITH OR WITHOUT PELVIS) 2-3V LEFT; DG HIP (WITH OR WITHOUT PELVIS) 2-3V RIGHT  COMPARISON:  None in PACs  FINDINGS: The bones are subjectively osteopenic. There is no lytic or blastic lesion. No acute or old fracture is observed. The hip joint spaces are well maintained. The articular surfaces of the femoral heads and acetabuli remains smoothly rounded. The femoral necks, intertrochanteric, immediate sub trochanteric regions are normal.  IMPRESSION: There is no acute or significant chronic bony abnormality of the hips.   Electronically Signed   By: David  Martinique M.D.   On: 10/25/2016 15:30    Note: Results of ordered imaging test(s) reviewed and explained to patient in Layman's terms. Copy of results provided to patient  Meds  The patient has a current medication list which includes the following prescription(s): celecoxib, duloxetine, duloxetine, gralise, and tramadol.  No current outpatient prescriptions on file prior to visit.   No current facility-administered medications on file prior to visit.    ROS  Constitutional: Denies any fever or chills Gastrointestinal: No reported hemesis, hematochezia, vomiting, or acute GI distress Musculoskeletal: Denies any acute onset joint swelling, redness, loss of ROM, or weakness Neurological: No reported episodes of acute onset apraxia, aphasia, dysarthria, agnosia, amnesia, paralysis, loss of coordination, or loss of consciousness  Allergies  Ms. Pavone is allergic to penicillins.  [place]  Drug: Ms. Mowers  reports that she does not use drugs. Alcohol:  reports that she drinks alcohol. Tobacco:  reports that she has never smoked. She has never used smokeless tobacco. Medical:  has a past medical history of Depression and Fibromyalgia. Family: family history is not on file.  Past  Surgical History:  Procedure Laterality Date  . ABDOMINAL HYSTERECTOMY    . BLADDER SURGERY    . CHOLECYSTECTOMY    . prolapse colon     Constitutional Exam  General appearance: Well nourished, well developed, and well hydrated. In no apparent acute distress Vitals:   11/15/16 0845  BP: 121/84  Pulse: 79  Resp:  14  Temp: 98.4 F (36.9 C)  TempSrc: Oral  SpO2: 100%  Weight: 174 lb (78.9 kg)  Height: 5' 6"  (1.676 m)   BMI Assessment: Estimated body mass index is 28.08 kg/m as calculated from the following:   Height as of this encounter: 5' 6"  (1.676 m).   Weight as of this encounter: 174 lb (78.9 kg).  BMI interpretation table: BMI level Category Range association with higher incidence of chronic pain  <18 kg/m2 Underweight   18.5-24.9 kg/m2 Ideal body weight   25-29.9 kg/m2 Overweight Increased incidence by 20%  30-34.9 kg/m2 Obese (Class I) Increased incidence by 68%  35-39.9 kg/m2 Severe obesity (Class II) Increased incidence by 136%  >40 kg/m2 Extreme obesity (Class III) Increased incidence by 254%   BMI Readings from Last 4 Encounters:  11/15/16 28.08 kg/m  10/11/16 26.78 kg/m   Wt Readings from Last 4 Encounters:  11/15/16 174 lb (78.9 kg)  10/11/16 171 lb (77.6 kg)  Psych/Mental status: Alert, oriented x 3 (person, place, & time)       Eyes: PERLA Respiratory: No evidence of acute respiratory distress  Cervical Spine Exam  Inspection: No masses, redness, or swelling Alignment: Symmetrical Functional ROM: Unrestricted ROM      Stability: No instability detected Muscle strength & Tone: Functionally intact Sensory: Unimpaired Palpation: No palpable anomalies              Upper Extremity (UE) Exam    Side: Right upper extremity  Side: Left upper extremity  Inspection: No masses, redness, swelling, or asymmetry. No contractures  Inspection: No masses, redness, swelling, or asymmetry. No contractures  Functional ROM: Unrestricted ROM          Functional ROM:  Unrestricted ROM          Muscle strength & Tone: Functionally intact  Muscle strength & Tone: Functionally intact  Sensory: Unimpaired  Sensory: Unimpaired  Palpation: No palpable anomalies              Palpation: No palpable anomalies              Specialized Test(s): Deferred         Specialized Test(s): Deferred          Thoracic Spine Exam  Inspection: No masses, redness, or swelling Alignment: Symmetrical Functional ROM: Unrestricted ROM Stability: No instability detected Sensory: Unimpaired Muscle strength & Tone: No palpable anomalies  Lumbar Spine Exam  Inspection: No masses, redness, or swelling Alignment: Symmetrical Functional ROM: Unrestricted ROM      Stability: No instability detected Muscle strength & Tone: Functionally intact Sensory: Unimpaired Palpation: No palpable anomalies       Provocative Tests: Lumbar Hyperextension and rotation test: evaluation deferred today       Patrick's Maneuver: evaluation deferred today                    Gait & Posture Assessment  Ambulation: Unassisted Gait: Relatively normal for age and body habitus Posture: WNL   Lower Extremity Exam    Side: Right lower extremity  Side: Left lower extremity  Inspection: No masses, redness, swelling, or asymmetry. No contractures  Inspection: No masses, redness, swelling, or asymmetry. No contractures  Functional ROM: Unrestricted ROM          Functional ROM: Unrestricted ROM          Muscle strength & Tone: Functionally intact  Muscle strength & Tone: Functionally intact  Sensory: Unimpaired  Sensory: Unimpaired  Palpation: No  palpable anomalies  Palpation: No palpable anomalies   Assessment & Plan  Primary Diagnosis & Pertinent Problem List: The primary encounter diagnosis was Chronic upper extremity pain (Location of Primary Source of Pain) (Bilateral) (L>R). 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  Visit Diagnosis: 1. Chronic upper extremity pain (Location of Primary Source of Pain) (Bilateral) (L>R)   2. Chronic low back pain (Location of Secondary source of pain) (Bilateral) (R>L)   3. Chronic shoulder pain (Location of Tertiary source of pain) (Bilateral) (R>L)   4. 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 Lumbar facet syndrome (Bilateral) (R>L)   6. Chronic sacroiliac joint pain (Right)   7. Chronic hip pain (Bilateral) (R>L)   8. Chronic pain syndrome   9. Long term prescription opiate use   10. Opiate use   11. Fibromyalgia    Problems updated and reviewed during this visit: No problems updated. Problem-specific Plan(s): No problem-specific Assessment & Plan notes found for this encounter.  Assessment & plan notes cannot be loaded without a specified hospital service.  Plan of Care  Pharmacotherapy (Medications Ordered): Meds ordered this encounter  Medications  . celecoxib (CELEBREX) 100 MG capsule    Sig: Take 1 capsule (100 mg total) by mouth 2 (two) times daily.    Dispense:  60 capsule    Refill:  2    Do not place medication on "Automatic Refill". Fill one day early if pharmacy is closed on scheduled refill date.  Marland Kitchen GRALISE 600 MG TABS    Sig: Take 600 mg by mouth at bedtime.    Dispense:  30 tablet    Refill:  2    Do not place medication on "Automatic Refill". Fill one day early if pharmacy is closed on scheduled refill date.  . traMADol (ULTRAM) 50 MG tablet    Sig: Take 1 tablet (50 mg total) by mouth 2 (two) times daily.    Dispense:  60 tablet    Refill:  2    Fill one day early if pharmacy is closed on scheduled refill  date. Do not fill until: 11/15/16 To last until: 02/13/17  . DULoxetine (CYMBALTA) 60 MG capsule    Sig: Take 1 capsule (60 mg total) by mouth daily.    Dispense:  30 capsule    Refill:  2    Do not place medication on "Automatic Refill". Fill one day early if pharmacy is closed on scheduled refill date.  . DULoxetine (CYMBALTA) 30 MG capsule    Sig: Take 1 capsule (30 mg total) by mouth daily.    Dispense:  30 capsule    Refill:  2    Do not place medication on "Automatic Refill". Fill one day early if pharmacy is closed on scheduled refill date.   Lab-work, procedure(s), and/or referral(s): Orders Placed This Encounter  Procedures  . SHOULDER INJECTION  . SHOULDER INJECTION    Pharmacotherapy: Opioid Analgesics: We'll take over management today. See above orders Membrane stabilizer: We have discussed the possibility of optimizing this mode of therapy, if tolerated Muscle relaxant: We have discussed the possibility of a trial NSAID: We have discussed the possibility of a  trial Other analgesic(s): To be determined at a later time   Interventional therapies: Planned, scheduled, and/or pending:    Diagnostic bilateral intra-articular shoulder joint injection under fluoroscopic guidance    Considering:   Possible diagnostic left-sided cervical epidural steroid injection  Diagnostic bilateral lumbar facet block  Possible bilateral lumbar facet RFA  Diagnostic right-sided intra-articular sacroiliac joint injection  Possible right SI joint RFA  Diagnostic bilateral intra-articular hip joint injection  Possible bilateral femoral nerve and obturator nerve block  Possible bilateral femoral nerve and obturator nerve RFA   PRN Procedures:   Diagnostic bilateral intra-articular shoulder joint injection    Provider-requested follow-up: Return in about 3 months (around 02/15/2017) for Med-Mgmt, w/ NP, in addition, PRN procedure(s), by MD.  Future Appointments Date Time Provider  [place]  11/23/2016 2:00 PM Milinda Pointer, MD [facility] None  02/15/2017 8:45 AM Vevelyn Francois, NP [facility] None    Primary Care Physician: Radene Journey, MD Location: [facility] Management Facility Note by: Kathlen Brunswick. Dossie Arbour, M.D, DABA, DABAPM, DABPM, DABIPP, FIPP Date: 11/15/2016; Time: 10:25 AM  Patient instructions provided during this appointment: Patient Instructions  You were given one prescription for Tramadol today. Prescriptions for Celebrex, Cymbalta 60 mg and 30 mg, and Gralise.  When you come for your shoulder injection, do not eat or drink 3 hours before your appointment.

## 2016-11-15 ENCOUNTER — Encounter: Payer: Self-pay | Admitting: Pain Medicine

## 2016-11-15 ENCOUNTER — Ambulatory Visit: Attending: Pain Medicine | Admitting: Pain Medicine

## 2016-11-15 VITALS — BP 121/84 | HR 79 | Temp 98.4°F | Resp 14 | Ht 66.0 in | Wt 174.0 lb

## 2016-11-15 DIAGNOSIS — M79602 Pain in left arm: Secondary | ICD-10-CM | POA: Insufficient documentation

## 2016-11-15 DIAGNOSIS — M25552 Pain in left hip: Secondary | ICD-10-CM | POA: Diagnosis not present

## 2016-11-15 DIAGNOSIS — M25551 Pain in right hip: Secondary | ICD-10-CM | POA: Diagnosis not present

## 2016-11-15 DIAGNOSIS — M5441 Lumbago with sciatica, right side: Secondary | ICD-10-CM

## 2016-11-15 DIAGNOSIS — M4696 Unspecified inflammatory spondylopathy, lumbar region: Secondary | ICD-10-CM | POA: Diagnosis not present

## 2016-11-15 DIAGNOSIS — M25559 Pain in unspecified hip: Secondary | ICD-10-CM

## 2016-11-15 DIAGNOSIS — Z981 Arthrodesis status: Secondary | ICD-10-CM | POA: Insufficient documentation

## 2016-11-15 DIAGNOSIS — M5442 Lumbago with sciatica, left side: Secondary | ICD-10-CM

## 2016-11-15 DIAGNOSIS — M797 Fibromyalgia: Secondary | ICD-10-CM | POA: Insufficient documentation

## 2016-11-15 DIAGNOSIS — N3281 Overactive bladder: Secondary | ICD-10-CM | POA: Insufficient documentation

## 2016-11-15 DIAGNOSIS — M19011 Primary osteoarthritis, right shoulder: Secondary | ICD-10-CM | POA: Diagnosis not present

## 2016-11-15 DIAGNOSIS — M533 Sacrococcygeal disorders, not elsewhere classified: Secondary | ICD-10-CM | POA: Insufficient documentation

## 2016-11-15 DIAGNOSIS — Z79891 Long term (current) use of opiate analgesic: Secondary | ICD-10-CM | POA: Diagnosis not present

## 2016-11-15 DIAGNOSIS — M79601 Pain in right arm: Secondary | ICD-10-CM | POA: Insufficient documentation

## 2016-11-15 DIAGNOSIS — M19012 Primary osteoarthritis, left shoulder: Secondary | ICD-10-CM | POA: Diagnosis not present

## 2016-11-15 DIAGNOSIS — F1190 Opioid use, unspecified, uncomplicated: Secondary | ICD-10-CM | POA: Diagnosis not present

## 2016-11-15 DIAGNOSIS — F329 Major depressive disorder, single episode, unspecified: Secondary | ICD-10-CM | POA: Insufficient documentation

## 2016-11-15 DIAGNOSIS — M25511 Pain in right shoulder: Secondary | ICD-10-CM

## 2016-11-15 DIAGNOSIS — M47816 Spondylosis without myelopathy or radiculopathy, lumbar region: Secondary | ICD-10-CM

## 2016-11-15 DIAGNOSIS — Z87440 Personal history of urinary (tract) infections: Secondary | ICD-10-CM | POA: Insufficient documentation

## 2016-11-15 DIAGNOSIS — M25512 Pain in left shoulder: Secondary | ICD-10-CM

## 2016-11-15 DIAGNOSIS — G894 Chronic pain syndrome: Secondary | ICD-10-CM | POA: Insufficient documentation

## 2016-11-15 DIAGNOSIS — D696 Thrombocytopenia, unspecified: Secondary | ICD-10-CM | POA: Insufficient documentation

## 2016-11-15 DIAGNOSIS — G8929 Other chronic pain: Secondary | ICD-10-CM

## 2016-11-15 MED ORDER — DULOXETINE HCL 30 MG PO CPEP: 30.0000 mg | ORAL_CAPSULE | Freq: Every day | ORAL | 2 refills | Status: DC

## 2016-11-15 MED ORDER — CELECOXIB 100 MG PO CAPS: 100.0000 mg | ORAL_CAPSULE | Freq: Two times a day (BID) | ORAL | 2 refills | Status: AC

## 2016-11-15 MED ORDER — DULOXETINE HCL 60 MG PO CPEP: 60.0000 mg | ORAL_CAPSULE | Freq: Every day | ORAL | 2 refills | Status: AC

## 2016-11-15 MED ORDER — GRALISE 600 MG PO TABS: 600.0000 mg | ORAL_TABLET | Freq: Every day | ORAL | 2 refills | Status: AC

## 2016-11-15 MED ORDER — TRAMADOL HCL 50 MG PO TABS: 50.0000 mg | ORAL_TABLET | Freq: Two times a day (BID) | ORAL | 2 refills | Status: AC

## 2016-11-15 NOTE — Progress Notes (Signed)
Safety precautions to be maintained throughout the outpatient stay will include: orient to surroundings, keep bed in low position, maintain call bell within reach at all times, provide assistance with transfer out of bed and ambulation.  

## 2016-11-15 NOTE — Patient Instructions (Addendum)
You were given one prescription for Tramadol today. Prescriptions for Celebrex, Cymbalta 60 mg and 30 mg, and Gralise.  When you come for your shoulder injection, do not eat or drink 3 hours before your appointment.

## 2016-11-23 ENCOUNTER — Ambulatory Visit (HOSPITAL_BASED_OUTPATIENT_CLINIC_OR_DEPARTMENT_OTHER): Admitting: Pain Medicine

## 2016-11-23 ENCOUNTER — Encounter: Payer: Self-pay | Admitting: Pain Medicine

## 2016-11-23 ENCOUNTER — Ambulatory Visit
Admission: RE | Admit: 2016-11-23 | Discharge: 2016-11-23 | Disposition: A | Discharge status: Home / Self Care | Source: Ambulatory Visit | Attending: Pain Medicine | Admitting: Pain Medicine

## 2016-11-23 VITALS — BP 118/92 | HR 72 | Temp 97.5°F | Resp 12 | Ht 66.0 in | Wt 174.0 lb

## 2016-11-23 DIAGNOSIS — Z90710 Acquired absence of both cervix and uterus: Secondary | ICD-10-CM | POA: Diagnosis not present

## 2016-11-23 DIAGNOSIS — M5412 Radiculopathy, cervical region: Secondary | ICD-10-CM | POA: Diagnosis not present

## 2016-11-23 DIAGNOSIS — G8929 Other chronic pain: Secondary | ICD-10-CM | POA: Insufficient documentation

## 2016-11-23 DIAGNOSIS — M542 Cervicalgia: Secondary | ICD-10-CM

## 2016-11-23 DIAGNOSIS — Z880 Allergy status to penicillin: Secondary | ICD-10-CM | POA: Diagnosis not present

## 2016-11-23 DIAGNOSIS — M79602 Pain in left arm: Secondary | ICD-10-CM | POA: Diagnosis not present

## 2016-11-23 DIAGNOSIS — M25512 Pain in left shoulder: Secondary | ICD-10-CM | POA: Diagnosis not present

## 2016-11-23 DIAGNOSIS — M25511 Pain in right shoulder: Secondary | ICD-10-CM

## 2016-11-23 DIAGNOSIS — M79601 Pain in right arm: Secondary | ICD-10-CM | POA: Diagnosis not present

## 2016-11-23 DIAGNOSIS — Z9049 Acquired absence of other specified parts of digestive tract: Secondary | ICD-10-CM | POA: Insufficient documentation

## 2016-11-23 MED ORDER — LIDOCAINE HCL (PF) 1 % IJ SOLN
INTRAMUSCULAR | Status: AC
  Filled 2016-11-23: qty 5

## 2016-11-23 MED ORDER — IOPAMIDOL (ISOVUE-M 200) INJECTION 41%
10.0000 mL | Freq: Once | INTRAMUSCULAR | Status: AC
  Administered 2016-11-23: 10 mL via EPIDURAL

## 2016-11-23 MED ORDER — ROPIVACAINE HCL 2 MG/ML IJ SOLN
1.0000 mL | Freq: Once | INTRAMUSCULAR | Status: AC
Start: 2016-11-23 — End: 2016-11-23
  Administered 2016-11-23: 10 mL via EPIDURAL

## 2016-11-23 MED ORDER — IOPAMIDOL (ISOVUE-M 200) INJECTION 41%
INTRAMUSCULAR | Status: AC
  Filled 2016-11-23: qty 10

## 2016-11-23 MED ORDER — ROPIVACAINE HCL 2 MG/ML IJ SOLN
INTRAMUSCULAR | Status: AC
  Filled 2016-11-23: qty 10

## 2016-11-23 MED ORDER — LIDOCAINE HCL (PF) 1.5 % IJ SOLN: 20.0000 mL | Freq: Once | INTRAMUSCULAR | Status: DC

## 2016-11-23 MED ORDER — DEXAMETHASONE SODIUM PHOSPHATE 10 MG/ML IJ SOLN
10.0000 mg | Freq: Once | INTRAMUSCULAR | Status: AC
  Administered 2016-11-23: 10 mg

## 2016-11-23 MED ORDER — LIDOCAINE HCL (PF) 1 % IJ SOLN: 5.0000 mL | Freq: Once | INTRAMUSCULAR | Status: DC

## 2016-11-23 MED ORDER — SODIUM CHLORIDE 0.9% FLUSH: 1.0000 mL | Freq: Once | INTRAVENOUS | Status: DC

## 2016-11-23 MED ORDER — DEXAMETHASONE SODIUM PHOSPHATE 10 MG/ML IJ SOLN
INTRAMUSCULAR | Status: AC
  Filled 2016-11-23: qty 1

## 2016-11-23 MED ORDER — SODIUM CHLORIDE 0.9 % IJ SOLN
INTRAMUSCULAR | Status: AC
  Filled 2016-11-23: qty 10

## 2016-11-23 NOTE — Patient Instructions (Addendum)
____________________________________________________________________________________________  Post-Procedure instructions Instructions:  Apply ice: Fill a plastic sandwich bag with crushed ice. Cover it with a small towel and apply to injection site. Apply for 15 minutes then remove x 15 minutes. Repeat sequence on day of procedure, until you go to bed. The purpose is to minimize swelling and discomfort after procedure.  Apply heat: Apply heat to procedure site starting the day following the procedure. The purpose is to treat any soreness and discomfort from the procedure.  Food intake: Start with clear liquids (like water) and advance to regular food, as tolerated.   Physical activities: Keep activities to a minimum for the first 8 hours after the procedure.   Driving: If you have received any sedation, you are not allowed to drive for 24 hours after your procedure.  Blood thinner: Restart your blood thinner 6 hours after your procedure. (Only for those taking blood thinners)  Insulin: As soon as you can eat, you may resume your normal dosing schedule. (Only for those taking insulin)  Infection prevention: Keep procedure site clean and dry.  Post-procedure Pain Diary: Extremely important that this be done correctly and accurately. Recorded information will be used to determine the next step in treatment.  Pain evaluated is that of treated area only. Do not include pain from an untreated area.  Complete every hour, on the hour, for the initial 8 hours. Set an alarm to help you do this part accurately.  Do not go to sleep and have it completed later. It will not be accurate.  Follow-up appointment: Keep your follow-up appointment after the procedure. Usually 2 weeks for most procedures. (6 weeks in the case of radiofrequency.) Bring you pain diary.  Expect:  From numbing medicine (AKA: Local Anesthetics): Numbness or decrease in pain.  Onset: Full effect within 15 minutes of  injected.  Duration: It will depend on the type of local anesthetic used. On the average, 1 to 8 hours.   From steroids: Decrease in swelling or inflammation. Once inflammation is improved, relief of the pain will follow.  Onset of benefits: Depends on the amount of swelling present. The more swelling, the longer it will take for the benefits to be seen. In some cases, up to 10 days.  Duration: Steroids will stay in the system x 2 weeks. Duration of benefits will depend on multiple posibilities including persistent irritating factors.  From procedure: Some discomfort is to be expected once the numbing medicine wears off. This should be minimal if ice and heat are applied as instructed. Call if:  You experience numbness and weakness that gets worse with time, as opposed to wearing off.  New onset bowel or bladder incontinence. (Spinal procedures only)  Emergency Numbers:  Durning business hours (Monday - Thursday, 8:00 AM - 4:00 PM) (Friday, 9:00 AM - 12:00 Noon): [phone]  After hours: [phone] ____________________________________________________________________________________________  Pain Management Discharge Instructions  General Discharge Instructions :  If you need to reach your doctor call: Monday-Friday 8:00 am - 4:00 pm at [phone] or toll free [phone].  After clinic hours [phone] to have operator reach doctor.  Bring all of your medication bottles to all your appointments in the pain clinic.  To cancel or reschedule your appointment with Pain Management please remember to call 24 hours in advance to avoid a fee.  Refer to the educational materials which you have been given on: General Risks, I had my Procedure. Discharge Instructions, Post Sedation.  Post Procedure Instructions:  The drugs you   were given will stay in your system until tomorrow, so for the next 24 hours you should not drive, make any legal decisions or drink any alcoholic  beverages.  You may eat anything you prefer, but it is better to start with liquids then soups and crackers, and gradually work up to solid foods.  Please notify your doctor immediately if you have any unusual bleeding, trouble breathing or pain that is not related to your normal pain.  Depending on the type of procedure that was done, some parts of your body may feel week and/or numb.  This usually clears up by tonight or the next day.  Walk with the use of an assistive device or accompanied by an adult for the 24 hours.  You may use ice on the affected area for the first 24 hours.  Put ice in a Ziploc bag and cover with a towel and place against area 15 minutes on 15 minutes off.  You may switch to heat after 24 hours. Epidural Steroid Injection An epidural steroid injection is a shot of steroid medicine and numbing medicine that is given into the space between the spinal cord and the bones in your back (epidural space). The shot helps relieve pain caused by an irritated or swollen nerve root. The amount of pain relief you get from the injection depends on what is causing the nerve to be swollen and irritated, and how long your pain lasts. You are more likely to benefit from this injection if your pain is strong and comes on suddenly rather than if you have had pain for a long time. Tell a health care provider about:  Any allergies you have.  All medicines you are taking, including vitamins, herbs, eye drops, creams, and over-the-counter medicines.  Any problems you or family members have had with anesthetic medicines.  Any blood disorders you have.  Any surgeries you have had.  Any medical conditions you have.  Whether you are pregnant or may be pregnant. What are the risks? Generally, this is a safe procedure. However, problems may occur, including:  Headache.  Bleeding.  Infection.  Allergic reaction to medicines.  Damage to your nerves.  What happens before the  procedure? Staying hydrated Follow instructions from your health care provider about hydration, which may include:  Up to 2 hours before the procedure - you may continue to drink clear liquids, such as water, clear fruit juice, black coffee, and plain tea.  Eating and drinking restrictions Follow instructions from your health care provider about eating and drinking, which may include:  8 hours before the procedure - stop eating heavy meals or foods such as meat, fried foods, or fatty foods.  6 hours before the procedure - stop eating light meals or foods, such as toast or cereal.  6 hours before the procedure - stop drinking milk or drinks that contain milk.  2 hours before the procedure - stop drinking clear liquids.  Medicine  You may be given medicines to lower anxiety.  Ask your health care provider about: ? Changing or stopping your regular medicines. This is especially important if you are taking diabetes medicines or blood thinners. ? Taking medicines such as aspirin and ibuprofen. These medicines can thin your blood. Do not take these medicines before your procedure if your health care provider instructs you not to. General instructions  Plan to have someone take you home from the hospital or clinic. What happens during the procedure?  You may receive a medicine   to help you relax (sedative).  You will be asked to lie on your abdomen.  The injection site will be cleaned.  A numbing medicine (local anesthetic) will be used to numb the injection site.  A needle will be inserted through your skin into the epidural space. You may feel some discomfort when this happens. An X-ray machine will be used to make sure the needle is put as close as possible to the affected nerve.  A steroid medicine and a local anesthetic will be injected into the epidural space.  The needle will be removed.  A bandage (dressing) will be put over the injection site. What happens after the  procedure?  Your blood pressure, heart rate, breathing rate, and blood oxygen level will be monitored until the medicines you were given have worn off.  Your arm or leg may feel weak or numb for a few hours.  The injection site may feel sore.  Do not drive for 24 hours if you received a sedative. This information is not intended to replace advice given to you by your health care provider. Make sure you discuss any questions you have with your health care provider. Document Released: 09/06/2007 Document Revised: 11/11/2015 Document Reviewed: 09/15/2015 Elsevier Interactive Patient Education  2017 [employer] GENERAL RISKS AND COMPLICATIONS  What are the risk, side effects and possible complications? Generally speaking, most procedures are safe.  However, with any procedure there are risks, side effects, and the possibility of complications.  The risks and complications are dependent upon the sites that are lesioned, or the type of nerve block to be performed.  The closer the procedure is to the spine, the more serious the risks are.  Great care is taken when placing the radio frequency needles, block needles or lesioning probes, but sometimes complications can occur. 1. Infection: Any time there is an injection through the skin, there is a risk of infection.  This is why sterile conditions are used for these blocks.  There are four possible types of infection. 1. Localized skin infection. 2. Central Nervous System Infection-This can be in the form of Meningitis, which can be deadly. 3. Epidural Infections-This can be in the form of an epidural abscess, which can cause pressure inside of the spine, causing compression of the spinal cord with subsequent paralysis. This would require an emergency surgery to decompress, and there are no guarantees that the patient would recover from the paralysis. 4. Discitis-This is an infection of the intervertebral discs.  It occurs in about 1% of discography  procedures.  It is difficult to treat and it may lead to surgery.        2. Pain: the needles have to go through skin and soft tissues, will cause soreness.       3. Damage to internal structures:  The nerves to be lesioned may be near blood vessels or    other nerves which can be potentially damaged.       4. Bleeding: Bleeding is more common if the patient is taking blood thinners such as  aspirin, Coumadin, Ticiid, Plavix, etc., or if he/she have some genetic predisposition  such as hemophilia. Bleeding into the spinal canal can cause compression of the spinal  cord with subsequent paralysis.  This would require an emergency surgery to  decompress and there are no guarantees that the patient would recover from the  paralysis.       5. Pneumothorax:  Puncturing of a lung is a possibility, every time   a needle is introduced in  the area of the chest or upper back.  Pneumothorax refers to free air around the  collapsed lung(s), inside of the thoracic cavity (chest cavity).  Another two possible  complications related to a similar event would include: Hemothorax and Chylothorax.   These are variations of the Pneumothorax, where instead of air around the collapsed  lung(s), you may have blood or chyle, respectively.       6. Spinal headaches: They may occur with any procedures in the area of the spine.       7. Persistent CSF (Cerebro-Spinal Fluid) leakage: This is a rare problem, but may occur  with prolonged intrathecal or epidural catheters either due to the formation of a fistulous  track or a dural tear.       8. Nerve damage: By working so close to the spinal cord, there is always a possibility of  nerve damage, which could be as serious as a permanent spinal cord injury with  paralysis.       9. Death:  Although rare, severe deadly allergic reactions known as "Anaphylactic  reaction" can occur to any of the medications used.      10. Worsening of the symptoms:  We can always make thing worse.  What  are the chances of something like this happening? Chances of any of this occuring are extremely low.  By statistics, you have more of a chance of getting killed in a motor vehicle accident: while driving to the hospital than any of the above occurring .  Nevertheless, you should be aware that they are possibilities.  In general, it is similar to taking a shower.  Everybody knows that you can slip, hit your head and get killed.  Does that mean that you should not shower again?  Nevertheless always keep in mind that statistics do not mean anything if you happen to be on the wrong side of them.  Even if a procedure has a 1 (one) in a 1,000,000 (million) chance of going wrong, it you happen to be that one..Also, keep in mind that by statistics, you have more of a chance of having something go wrong when taking medications.  Who should not have this procedure? If you are on a blood thinning medication (e.g. Coumadin, Plavix, see list of "Blood Thinners"), or if you have an active infection going on, you should not have the procedure.  If you are taking any blood thinners, please inform your physician.  How should I prepare for this procedure?  Do not eat or drink anything at least six hours prior to the procedure.  Bring a driver with you .  It cannot be a taxi.  Come accompanied by an adult that can drive you back, and that is strong enough to help you if your legs get weak or numb from the local anesthetic.  Take all of your medicines the morning of the procedure with just enough water to swallow them.  If you have diabetes, make sure that you are scheduled to have your procedure done first thing in the morning, whenever possible.  If you have diabetes, take only half of your insulin dose and notify our nurse that you have done so as soon as you arrive at the clinic.  If you are diabetic, but only take blood sugar pills (oral hypoglycemic), then do not take them on the morning of your procedure.   You may take them after you have had the procedure.    Do not take aspirin or any aspirin-containing medications, at least eleven (11) days prior to the procedure.  They may prolong bleeding.  Wear loose fitting clothing that may be easy to take off and that you would not mind if it got stained with Betadine or blood.  Do not wear any jewelry or perfume  Remove any nail coloring.  It will interfere with some of our monitoring equipment.  NOTE: Remember that this is not meant to be interpreted as a complete list of all possible complications.  Unforeseen problems may occur.  BLOOD THINNERS The following drugs contain aspirin or other products, which can cause increased bleeding during surgery and should not be taken for 2 weeks prior to and 1 week after surgery.  If you should need take something for relief of minor pain, you may take acetaminophen which is found in Tylenol,m Datril, Anacin-3 and Panadol. It is not blood thinner. The products listed below are.  Do not take any of the products listed below in addition to any listed on your instruction sheet.  A.P.C or A.P.C with Codeine Codeine Phosphate Capsules #3 Ibuprofen Ridaura  ABC compound Congesprin Imuran rimadil  Advil Cope Indocin Robaxisal  Alka-Seltzer Effervescent Pain Reliever and Antacid Coricidin or Coricidin-D  Indomethacin Rufen  Alka-Seltzer plus Cold Medicine Cosprin Ketoprofen S-A-C Tablets  Anacin Analgesic Tablets or Capsules Coumadin Korlgesic Salflex  Anacin Extra Strength Analgesic tablets or capsules CP-2 Tablets Lanoril Salicylate  Anaprox Cuprimine Capsules Levenox Salocol  Anexsia-D Dalteparin Magan Salsalate  Anodynos Darvon compound Magnesium Salicylate Sine-off  Ansaid Dasin Capsules Magsal Sodium Salicylate  Anturane Depen Capsules Marnal Soma  APF Arthritis pain formula Dewitt's Pills Measurin Stanback  Argesic Dia-Gesic Meclofenamic Sulfinpyrazone  Arthritis Bayer Timed Release Aspirin Diclofenac  Meclomen Sulindac  Arthritis pain formula Anacin Dicumarol Medipren Supac  Analgesic (Safety coated) Arthralgen Diffunasal Mefanamic Suprofen  Arthritis Strength Bufferin Dihydrocodeine Mepro Compound Suprol  Arthropan liquid Dopirydamole Methcarbomol with Aspirin Synalgos  ASA tablets/Enseals Disalcid Micrainin Tagament  Ascriptin Doan's Midol Talwin  Ascriptin A/D Dolene Mobidin Tanderil  Ascriptin Extra Strength Dolobid Moblgesic Ticlid  Ascriptin with Codeine Doloprin or Doloprin with Codeine Momentum Tolectin  Asperbuf Duoprin Mono-gesic Trendar  Aspergum Duradyne Motrin or Motrin IB Triminicin  Aspirin plain, buffered or enteric coated Durasal Myochrisine Trigesic  Aspirin Suppositories Easprin Nalfon Trillsate  Aspirin with Codeine Ecotrin Regular or Extra Strength Naprosyn Uracel  Atromid-S Efficin Naproxen Ursinus  Auranofin Capsules Elmiron Neocylate Vanquish  Axotal Emagrin Norgesic Verin  Azathioprine Empirin or Empirin with Codeine Normiflo Vitamin E  Azolid Emprazil Nuprin Voltaren  Bayer Aspirin plain, buffered or children's or timed BC Tablets or powders Encaprin Orgaran Warfarin Sodium  Buff-a-Comp Enoxaparin Orudis Zorpin  Buff-a-Comp with Codeine Equegesic Os-Cal-Gesic   Buffaprin Excedrin plain, buffered or Extra Strength Oxalid   Bufferin Arthritis Strength Feldene Oxphenbutazone   Bufferin plain or Extra Strength Feldene Capsules Oxycodone with Aspirin   Bufferin with Codeine Fenoprofen Fenoprofen Pabalate or Pabalate-SF   Buffets II Flogesic Panagesic   Buffinol plain or Extra Strength Florinal or Florinal with Codeine Panwarfarin   Buf-Tabs Flurbiprofen Penicillamine   Butalbital Compound Four-way cold tablets Penicillin   Butazolidin Fragmin Pepto-Bismol   Carbenicillin Geminisyn Percodan   Carna Arthritis Reliever Geopen Persantine   Carprofen Gold's salt Persistin   Chloramphenicol Goody's Phenylbutazone   Chloromycetin Haltrain Piroxlcam   Clmetidine  heparin Plaquenil   Cllnoril Hyco-pap Ponstel   Clofibrate Hydroxy chloroquine Propoxyphen         Before stopping   any of these medications, be sure to consult the physician who ordered them.  Some, such as Coumadin (Warfarin) are ordered to prevent or treat serious conditions such as "deep thrombosis", "pumonary embolisms", and other heart problems.  The amount of time that you may need off of the medication may also vary with the medication and the reason for which you were taking it.  If you are taking any of these medications, please make sure you notify your pain physician before you undergo any procedures.          

## 2016-11-23 NOTE — Progress Notes (Signed)
Patient's Name: Sarah Morales  MRN: [id number]  Referring Provider: Ellison Carwin, MD  DOB: 01/20/1956  PCP: Ellison Carwin, MD  DOS: 11/23/2016  Note by: Sydnee Levans. Laban Emperor, MD  Service setting: Ambulatory outpatient  Location: [facility] (AMB) Pain Management Facility  Visit type: Procedure  Specialty: Interventional Pain Management  Patient type: Established   Primary Reason for Visit: Interventional Pain Management Treatment. CC: Neck Pain (between shoulders) and Back Pain (centrally located)  Procedure:  Anesthesia, Analgesia, Anxiolysis:  Type: Diagnostic, Inter-Laminar, Epidural Steroid Injection Region: Posterior Cervico-thoracic Region Level: C7-T1 Laterality: Right-Sided Paramedial  Type: Local Anesthesia Local Anesthetic: Lidocaine 1% Route: Infiltration ([place]/IM) IV Access: Declined Sedation: Declined  Indication(s): Analgesia          Indications: 1. Cervical radiculitis (Bilateral) (L>R)   2. Chronic midline posterior neck pain   3. Chronic upper extremity pain (Location of Primary Source of Pain) (Bilateral) (L>R)   4. Chronic shoulder pain (Location of Tertiary source of pain) (Bilateral) (R>L)    Pain Score: Pre-procedure: 2 /10 Post-procedure: 0-No pain/10  Pre-op Assessment:  Previous date of service: 11/15/16 Service provided: Evaluation, Med Refill Sarah Morales is a 61 y.o. (year old), female patient, seen today for interventional treatment. She  has a past surgical history that includes Cholecystectomy; Bladder surgery; prolapse colon; and Abdominal hysterectomy. Her primarily concern today is the Neck Pain (between shoulders) and Back Pain (centrally located)  Initial Vital Signs: Blood pressure 127/80, pulse 69, temperature 97.5 F (36.4 C), temperature source Oral, resp. rate 18, height 5\' 6"  (1.676 m), weight 174 lb (78.9 kg), SpO2 100 %. BMI: 28.08 kg/m  Risk Assessment: Allergies: Reviewed. She is allergic to penicillins.  Allergy Precautions: None  required Coagulopathies: Reviewed. None identified.  Blood-thinner therapy: None at this time Active Infection(s): Reviewed. None identified. Sarah Morales is afebrile  Site Confirmation: Sarah Morales was asked to confirm the procedure and laterality before marking the site Procedure checklist: Completed Consent: Before the procedure and under the influence of no sedative(s), amnesic(s), or anxiolytics, the patient was informed of the treatment options, risks and possible complications. To fulfill our ethical and legal obligations, as recommended by the American Medical Association's Code of Ethics, I have informed the patient of my clinical impression; the nature and purpose of the treatment or procedure; the risks, benefits, and possible complications of the intervention; the alternatives, including doing nothing; the risk(s) and benefit(s) of the alternative treatment(s) or procedure(s); and the risk(s) and benefit(s) of doing nothing. The patient was provided information about the general risks and possible complications associated with the procedure. These may include, but are not limited to: failure to achieve desired goals, infection, bleeding, organ or nerve damage, allergic reactions, paralysis, and death. In addition, the patient was informed of those risks and complications associated to Spine-related procedures, such as failure to decrease pain; infection (i.e.: Meningitis, epidural or intraspinal abscess); bleeding (i.e.: epidural hematoma, subarachnoid hemorrhage, or any other type of intraspinal or peri-dural bleeding); organ or nerve damage (i.e.: Any type of peripheral nerve, nerve root, or spinal cord injury) with subsequent damage to sensory, motor, and/or autonomic systems, resulting in permanent pain, numbness, and/or weakness of one or several areas of the body; allergic reactions; (i.e.: anaphylactic reaction); and/or death. Furthermore, the patient was informed of those risks and  complications associated with the medications. These include, but are not limited to: allergic reactions (i.e.: anaphylactic or anaphylactoid reaction(s)); adrenal axis suppression; blood sugar elevation that in diabetics may result in ketoacidosis or comma;  water retention that in patients with history of congestive heart failure may result in shortness of breath, pulmonary edema, and decompensation with resultant heart failure; weight gain; swelling or edema; medication-induced neural toxicity; particulate matter embolism and blood vessel occlusion with resultant organ, and/or nervous system infarction; and/or aseptic necrosis of one or more joints. Finally, the patient was informed that Medicine is not an exact science; therefore, there is also the possibility of unforeseen or unpredictable risks and/or possible complications that may result in a catastrophic outcome. The patient indicated having understood very clearly. We have given the patient no guarantees and we have made no promises. Enough time was given to the patient to ask questions, all of which were answered to the patient's satisfaction. Sarah Morales has indicated that she wanted to continue with the procedure. Attestation: I, the ordering provider, attest that I have discussed with the patient the benefits, risks, side-effects, alternatives, likelihood of achieving goals, and potential problems during recovery for the procedure that I have provided informed consent. Date: 11/23/2016; Time: 1:54 PM  Pre-Procedure Preparation:  Monitoring: As per clinic protocol. Respiration, ETCO2, SpO2, BP, heart rate and rhythm monitor placed and checked for adequate function Safety Precautions: Patient was assessed for positional comfort and pressure points before starting the procedure. Time-out: I initiated and conducted the "Time-out" before starting the procedure, as per protocol. The patient was asked to participate by confirming the accuracy of the  "Time Out" information. Verification of the correct person, site, and procedure were performed and confirmed by me, the nursing staff, and the patient. "Time-out" conducted as per Joint Commission's Universal Protocol (UP.01.01.01). "Time-out" Date & Time: 11/23/2016; 1417 hrs.  Description of Procedure Process:   Position: Prone with head of the table was raised to facilitate breathing. Target Area: For Epidural Steroid injections the target is the interlaminar space, initially targeting the lower border of the superior vertebral body lamina. Approach: Paramedial approach. Area Prepped: Entire PosteriorCervical Region Prepping solution: ChloraPrep (2% chlorhexidine gluconate and 70% isopropyl alcohol) Safety Precautions: Aspiration looking for blood return was conducted prior to all injections. At no point did we inject any substances, as a needle was being advanced. No attempts were made at seeking any paresthesias. Safe injection practices and needle disposal techniques used. Medications properly checked for expiration dates. SDV (single dose vial) medications used. Description of the Procedure: Protocol guidelines were followed. The procedure needle was introduced through the skin, ipsilateral to the reported pain, and advanced to the target area. Bone was contacted and the needle walked caudad, until the lamina was cleared. The epidural space was identified using "loss-of-resistance technique" with 2-3 ml of PF-NaCl (0.9% NSS), in a 5cc LOR glass syringe. Vitals:   11/23/16 1425 11/23/16 1428 11/23/16 1431 11/23/16 1435  BP: 114/78 128/78 124/75 (!) 118/92  Pulse: 77 75 76 72  Resp: 16 (!) 9 10 12   Temp:      TempSrc:      SpO2: 99% 100% 100% 100%  Weight:      Height:        Start Time: 1418 hrs. End Time: 1427 hrs. Materials:  Needle(s) Type: Epidural needle Gauge: 17G Length: 3.5-in Medication(s): We administered iopamidol, dexamethasone, and ropivacaine (PF) 2 mg/mL (0.2%). Please  see chart orders for dosing details.  Imaging Guidance (Spinal):  Type of Imaging Technique: Fluoroscopy Guidance (Spinal) Indication(s): Assistance in needle guidance and placement for procedures requiring needle placement in or near specific anatomical locations not easily accessible without such assistance. Exposure Time:  Please see nurses notes. Contrast: Before injecting any contrast, we confirmed that the patient did not have an allergy to iodine, shellfish, or radiological contrast. Once satisfactory needle placement was completed at the desired level, radiological contrast was injected. Contrast injected under live fluoroscopy. No contrast complications. See chart for type and volume of contrast used. Fluoroscopic Guidance: I was personally present during the use of fluoroscopy. "Tunnel Vision Technique" used to obtain the best possible view of the target area. Parallax error corrected before commencing the procedure. "Direction-depth-direction" technique used to introduce the needle under continuous pulsed fluoroscopy. Once target was reached, antero-posterior, oblique, and lateral fluoroscopic projection used confirm needle placement in all planes. Images permanently stored in EMR. Interpretation: I personally interpreted the imaging intraoperatively. Adequate needle placement confirmed in multiple planes. Appropriate spread of contrast into desired area was observed. No evidence of afferent or efferent intravascular uptake. No intrathecal or subarachnoid spread observed. Permanent images saved into the patient's record.  Antibiotic Prophylaxis:  Indication(s): None identified Antibiotic given: None  Post-operative Assessment:  EBL: None Complications: No immediate post-treatment complications observed by team, or reported by patient. Note: The patient tolerated the entire procedure well. A repeat set of vitals were taken after the procedure and the patient was kept under observation  following institutional policy, for this type of procedure. Post-procedural neurological assessment was performed, showing return to baseline, prior to discharge. The patient was provided with post-procedure discharge instructions, including a section on how to identify potential problems. Should any problems arise concerning this procedure, the patient was given instructions to immediately contact us, at any time, without hesitation. In any case, we plan to contact the patient by telephone for a follow-up status report regarding this interventional procedure. Comments:  No additional relevant information.  Plan of Care  Disposition: Discharge home  Discharge Date & Time: 11/23/2016; 1432 hrs.  Physician-requested Follow-up:  Return for post-procedure eval (in 2 wks), by MD.  Future Appointments Date Time Provider Department Center  12/29/2016 9:00 AM Delano Metz, MD [facility] None  02/15/2017 8:45 AM Barbette Merino, NP [facility] None   Medications ordered for procedure: Meds ordered this encounter  Medications  . DISCONTD: lidocaine 1.5 % injection 20 mL    This order is for documentation purposes only. Lidocaine w/ preservative included in block trays.  . iopamidol (ISOVUE-M) 41 % intrathecal injection 10 mL  . dexamethasone (DECADRON) injection 10 mg  . ropivacaine (PF) 2 mg/mL (0.2%) (NAROPIN) injection 1 mL  . sodium chloride flush (NS) 0.9 % injection 1 mL  . lidocaine (PF) (XYLOCAINE) 1 % injection 5 mL   Medications administered: We administered iopamidol, dexamethasone, and ropivacaine (PF) 2 mg/mL (0.2%).  See the medical record for exact dosing, route, and time of administration.  Lab-work, Procedure(s), & Referral(s) Ordered: Orders Placed This Encounter  Procedures  . Cervical Epidural Injection  . DG C-Arm 1-60 Min-No Report  . Provider attestation of informed consent for procedure/surgical case  . Verify informed consent  . Discharge instructions  . Follow-up   . Informed Consent Details: Transcribe to consent form and obtain patient signature   Imaging Ordered: New Prescriptions   No medications on file   Primary Care Physician: Ellison Carwin, MD Location: [facility] Note by: Sydnee Levans. Laban Emperor, M.D, DABA, DABAPM, DABPM, DABIPP, FIPP Date: 11/23/2016; Time: 3:12 PM  Disclaimer:  Medicine is not an Visual merchandiser. The only guarantee in medicine is that nothing is guaranteed. It is important to note that  the decision to proceed with this intervention was based on the information collected from the patient. The Data and conclusions were drawn from the patient's questionnaire, the interview, and the physical examination. Because the information was provided in large part by the patient, it cannot be guaranteed that it has not been purposely or unconsciously manipulated. Every effort has been made to obtain as much relevant data as possible for this evaluation. It is important to note that the conclusions that lead to this procedure are derived in large part from the available data. Always take into account that the treatment will also be dependent on availability of resources and existing treatment guidelines, considered by other Pain Management Practitioners as being common knowledge and practice, at the time of the intervention. For Medico-Legal purposes, it is also important to point out that variation in procedural techniques and pharmacological choices are the acceptable norm. The indications, contraindications, technique, and results of the above procedure should only be interpreted and judged by a Board-Certified Interventional Pain Specialist with extensive familiarity and expertise in the same exact procedure and technique.  Instructions provided at this appointment: Patient Instructions   ____________________________________________________________________________________________  Post-Procedure  instructions Instructions:  Apply ice: Fill a plastic sandwich bag with crushed ice. Cover it with a small towel and apply to injection site. Apply for 15 minutes then remove x 15 minutes. Repeat sequence on day of procedure, until you go to bed. The purpose is to minimize swelling and discomfort after procedure.  Apply heat: Apply heat to procedure site starting the day following the procedure. The purpose is to treat any soreness and discomfort from the procedure.  Food intake: Start with clear liquids (like water) and advance to regular food, as tolerated.   Physical activities: Keep activities to a minimum for the first 8 hours after the procedure.   Driving: If you have received any sedation, you are not allowed to drive for 24 hours after your procedure.  Blood thinner: Restart your blood thinner 6 hours after your procedure. (Only for those taking blood thinners)  Insulin: As soon as you can eat, you may resume your normal dosing schedule. (Only for those taking insulin)  Infection prevention: Keep procedure site clean and dry.  Post-procedure Pain Diary: Extremely important that this be done correctly and accurately. Recorded information will be used to determine the next step in treatment.  Pain evaluated is that of treated area only. Do not include pain from an untreated area.  Complete every hour, on the hour, for the initial 8 hours. Set an alarm to help you do this part accurately.  Do not go to sleep and have it completed later. It will not be accurate.  Follow-up appointment: Keep your follow-up appointment after the procedure. Usually 2 weeks for most procedures. (6 weeks in the case of radiofrequency.) Bring you pain diary.  Expect:  From numbing medicine (AKA: Local Anesthetics): Numbness or decrease in pain.  Onset: Full effect within 15 minutes of injected.  Duration: It will depend on the type of local anesthetic used. On the average, 1 to 8 hours.   From  steroids: Decrease in swelling or inflammation. Once inflammation is improved, relief of the pain will follow.  Onset of benefits: Depends on the amount of swelling present. The more swelling, the longer it will take for the benefits to be seen. In some cases, up to 10 days.  Duration: Steroids will stay in the system x 2 weeks. Duration of benefits will depend on  multiple posibilities including persistent irritating factors.  From procedure: Some discomfort is to be expected once the numbing medicine wears off. This should be minimal if ice and heat are applied as instructed. Call if:  You experience numbness and weakness that gets worse with time, as opposed to wearing off.  New onset bowel or bladder incontinence. (Spinal procedures only)  Emergency Numbers:  Durning business hours (Monday - Thursday, 8:00 AM - 4:00 PM) (Friday, 9:00 AM - 12:00 Noon): [phone]  After hours: [phone] ____________________________________________________________________________________________  Pain Management Discharge Instructions  General Discharge Instructions :  If you need to reach your doctor call: Monday-Friday 8:00 am - 4:00 pm at [phone] or toll free [phone].  After clinic hours [phone] to have operator reach doctor.  Bring all of your medication bottles to all your appointments in the pain clinic.  To cancel or reschedule your appointment with Pain Management please remember to call 24 hours in advance to avoid a fee.  Refer to the educational materials which you have been given on: General Risks, I had my Procedure. Discharge Instructions, Post Sedation.  Post Procedure Instructions:  The drugs you were given will stay in your system until tomorrow, so for the next 24 hours you should not drive, make any legal decisions or drink any alcoholic beverages.  You may eat anything you prefer, but it is better to start with liquids then soups and crackers, and  gradually work up to solid foods.  Please notify your doctor immediately if you have any unusual bleeding, trouble breathing or pain that is not related to your normal pain.  Depending on the type of procedure that was done, some parts of your body may feel week and/or numb.  This usually clears up by tonight or the next day.  Walk with the use of an assistive device or accompanied by an adult for the 24 hours.  You may use ice on the affected area for the first 24 hours.  Put ice in a Ziploc bag and cover with a towel and place against area 15 minutes on 15 minutes off.  You may switch to heat after 24 hours. Epidural Steroid Injection An epidural steroid injection is a shot of steroid medicine and numbing medicine that is given into the space between the spinal cord and the bones in your back (epidural space). The shot helps relieve pain caused by an irritated or swollen nerve root. The amount of pain relief you get from the injection depends on what is causing the nerve to be swollen and irritated, and how long your pain lasts. You are more likely to benefit from this injection if your pain is strong and comes on suddenly rather than if you have had pain for a long time. Tell a health care provider about:  Any allergies you have.  All medicines you are taking, including vitamins, herbs, eye drops, creams, and over-the-counter medicines.  Any problems you or family members have had with anesthetic medicines.  Any blood disorders you have.  Any surgeries you have had.  Any medical conditions you have.  Whether you are pregnant or may be pregnant. What are the risks? Generally, this is a safe procedure. However, problems may occur, including:  Headache.  Bleeding.  Infection.  Allergic reaction to medicines.  Damage to your nerves.  What happens before the procedure? Staying hydrated Follow instructions from your health care provider about hydration, which may include:  Up  to 2 hours before the procedure -  you may continue to drink clear liquids, such as water, clear fruit juice, black coffee, and plain tea.  Eating and drinking restrictions Follow instructions from your health care provider about eating and drinking, which may include:  8 hours before the procedure - stop eating heavy meals or foods such as meat, fried foods, or fatty foods.  6 hours before the procedure - stop eating light meals or foods, such as toast or cereal.  6 hours before the procedure - stop drinking milk or drinks that contain milk.  2 hours before the procedure - stop drinking clear liquids.  Medicine  You may be given medicines to lower anxiety.  Ask your health care provider about: ? Changing or stopping your regular medicines. This is especially important if you are taking diabetes medicines or blood thinners. ? Taking medicines such as aspirin and ibuprofen. These medicines can thin your blood. Do not take these medicines before your procedure if your health care provider instructs you not to. General instructions  Plan to have someone take you home from the hospital or clinic. What happens during the procedure?  You may receive a medicine to help you relax (sedative).  You will be asked to lie on your abdomen.  The injection site will be cleaned.  A numbing medicine (local anesthetic) will be used to numb the injection site.  A needle will be inserted through your skin into the epidural space. You may feel some discomfort when this happens. An X-ray machine will be used to make sure the needle is put as close as possible to the affected nerve.  A steroid medicine and a local anesthetic will be injected into the epidural space.  The needle will be removed.  A bandage (dressing) will be put over the injection site. What happens after the procedure?  Your blood pressure, heart rate, breathing rate, and blood oxygen level will be monitored until the medicines you  were given have worn off.  Your arm or leg may feel weak or numb for a few hours.  The injection site may feel sore.  Do not drive for 24 hours if you received a sedative. This information is not intended to replace advice given to you by your health care provider. Make sure you discuss any questions you have with your health care provider. Document Released: 09/06/2007 Document Revised: 11/11/2015 Document Reviewed: 09/15/2015 Elsevier Interactive Patient Education  2017 [employer] GENERAL RISKS AND COMPLICATIONS  What are the risk, side effects and possible complications? Generally speaking, most procedures are safe.  However, with any procedure there are risks, side effects, and the possibility of complications.  The risks and complications are dependent upon the sites that are lesioned, or the type of nerve block to be performed.  The closer the procedure is to the spine, the more serious the risks are.  Great care is taken when placing the radio frequency needles, block needles or lesioning probes, but sometimes complications can occur. 1. Infection: Any time there is an injection through the skin, there is a risk of infection.  This is why sterile conditions are used for these blocks.  There are four possible types of infection. 1. Localized skin infection. 2. Central Nervous System Infection-This can be in the form of Meningitis, which can be deadly. 3. Epidural Infections-This can be in the form of an epidural abscess, which can cause pressure inside of the spine, causing compression of the spinal cord with subsequent paralysis. This would require an emergency surgery  to decompress, and there are no guarantees that the patient would recover from the paralysis. 4. Discitis-This is an infection of the intervertebral discs.  It occurs in about 1% of discography procedures.  It is difficult to treat and it may lead to surgery.        2. Pain: the needles have to go through skin and soft  tissues, will cause soreness.       3. Damage to internal structures:  The nerves to be lesioned may be near blood vessels or    other nerves which can be potentially damaged.       4. Bleeding: Bleeding is more common if the patient is taking blood thinners such as  aspirin, Coumadin, Ticiid, Plavix, etc., or if he/she have some genetic predisposition  such as hemophilia. Bleeding into the spinal canal can cause compression of the spinal  cord with subsequent paralysis.  This would require an emergency surgery to  decompress and there are no guarantees that the patient would recover from the  paralysis.       5. Pneumothorax:  Puncturing of a lung is a possibility, every time a needle is introduced in  the area of the chest or upper back.  Pneumothorax refers to free air around the  collapsed lung(s), inside of the thoracic cavity (chest cavity).  Another two possible  complications related to a similar event would include: Hemothorax and Chylothorax.   These are variations of the Pneumothorax, where instead of air around the collapsed  lung(s), you may have blood or chyle, respectively.       6. Spinal headaches: They may occur with any procedures in the area of the spine.       7. Persistent CSF (Cerebro-Spinal Fluid) leakage: This is a rare problem, but may occur  with prolonged intrathecal or epidural catheters either due to the formation of a fistulous  track or a dural tear.       8. Nerve damage: By working so close to the spinal cord, there is always a possibility of  nerve damage, which could be as serious as a permanent spinal cord injury with  paralysis.       9. Death:  Although rare, severe deadly allergic reactions known as "Anaphylactic  reaction" can occur to any of the medications used.      10. Worsening of the symptoms:  We can always make thing worse.  What are the chances of something like this happening? Chances of any of this occuring are extremely low.  By statistics, you have  more of a chance of getting killed in a motor vehicle accident: while driving to the hospital than any of the above occurring .  Nevertheless, you should be aware that they are possibilities.  In general, it is similar to taking a shower.  Everybody knows that you can slip, hit your head and get killed.  Does that mean that you should not shower again?  Nevertheless always keep in mind that statistics do not mean anything if you happen to be on the wrong side of them.  Even if a procedure has a 1 (one) in a 1,000,000 (million) chance of going wrong, it you happen to be that one..Also, keep in mind that by statistics, you have more of a chance of having something go wrong when taking medications.  Who should not have this procedure? If you are on a blood thinning medication (e.g. Coumadin, Plavix, see list of "Blood Thinners"), or if  you have an active infection going on, you should not have the procedure.  If you are taking any blood thinners, please inform your physician.  How should I prepare for this procedure?  Do not eat or drink anything at least six hours prior to the procedure.  Bring a driver with you .  It cannot be a taxi.  Come accompanied by an adult that can drive you back, and that is strong enough to help you if your legs get weak or numb from the local anesthetic.  Take all of your medicines the morning of the procedure with just enough water to swallow them.  If you have diabetes, make sure that you are scheduled to have your procedure done first thing in the morning, whenever possible.  If you have diabetes, take only half of your insulin dose and notify our nurse that you have done so as soon as you arrive at the clinic.  If you are diabetic, but only take blood sugar pills (oral hypoglycemic), then do not take them on the morning of your procedure.  You may take them after you have had the procedure.  Do not take aspirin or any aspirin-containing medications, at least eleven  (11) days prior to the procedure.  They may prolong bleeding.  Wear loose fitting clothing that may be easy to take off and that you would not mind if it got stained with Betadine or blood.  Do not wear any jewelry or perfume  Remove any nail coloring.  It will interfere with some of our monitoring equipment.  NOTE: Remember that this is not meant to be interpreted as a complete list of all possible complications.  Unforeseen problems may occur.  BLOOD THINNERS The following drugs contain aspirin or other products, which can cause increased bleeding during surgery and should not be taken for 2 weeks prior to and 1 week after surgery.  If you should need take something for relief of minor pain, you may take acetaminophen which is found in Tylenol,m Datril, Anacin-3 and Panadol. It is not blood thinner. The products listed below are.  Do not take any of the products listed below in addition to any listed on your instruction sheet.  A.P.C or A.P.C with Codeine Codeine Phosphate Capsules #3 Ibuprofen Ridaura  ABC compound Congesprin Imuran rimadil  Advil Cope Indocin Robaxisal  Alka-Seltzer Effervescent Pain Reliever and Antacid Coricidin or Coricidin-D  Indomethacin Rufen  Alka-Seltzer plus Cold Medicine Cosprin Ketoprofen S-A-C Tablets  Anacin Analgesic Tablets or Capsules Coumadin Korlgesic Salflex  Anacin Extra Strength Analgesic tablets or capsules CP-2 Tablets Lanoril Salicylate  Anaprox Cuprimine Capsules Levenox Salocol  Anexsia-D Dalteparin Magan Salsalate  Anodynos Darvon compound Magnesium Salicylate Sine-off  Ansaid Dasin Capsules Magsal Sodium Salicylate  Anturane Depen Capsules Marnal Soma  APF Arthritis pain formula Dewitt's Pills Measurin Stanback  Argesic Dia-Gesic Meclofenamic Sulfinpyrazone  Arthritis Bayer Timed Release Aspirin Diclofenac Meclomen Sulindac  Arthritis pain formula Anacin Dicumarol Medipren Supac  Analgesic (Safety coated) Arthralgen Diffunasal Mefanamic  Suprofen  Arthritis Strength Bufferin Dihydrocodeine Mepro Compound Suprol  Arthropan liquid Dopirydamole Methcarbomol with Aspirin Synalgos  ASA tablets/Enseals Disalcid Micrainin Tagament  Ascriptin Doan's Midol Talwin  Ascriptin A/D Dolene Mobidin Tanderil  Ascriptin Extra Strength Dolobid Moblgesic Ticlid  Ascriptin with Codeine Doloprin or Doloprin with Codeine Momentum Tolectin  Asperbuf Duoprin Mono-gesic Trendar  Aspergum Duradyne Motrin or Motrin IB Triminicin  Aspirin plain, buffered or enteric coated Durasal Myochrisine Trigesic  Aspirin Suppositories Easprin Nalfon Trillsate  Aspirin with  Codeine Ecotrin Regular or Extra Strength Naprosyn Uracel  Atromid-S Efficin Naproxen Ursinus  Auranofin Capsules Elmiron Neocylate Vanquish  Axotal Emagrin Norgesic Verin  Azathioprine Empirin or Empirin with Codeine Normiflo Vitamin E  Azolid Emprazil Nuprin Voltaren  Bayer Aspirin plain, buffered or children's or timed BC Tablets or powders Encaprin Orgaran Warfarin Sodium  Buff-a-Comp Enoxaparin Orudis Zorpin  Buff-a-Comp with Codeine Equegesic Os-Cal-Gesic   Buffaprin Excedrin plain, buffered or Extra Strength Oxalid   Bufferin Arthritis Strength Feldene Oxphenbutazone   Bufferin plain or Extra Strength Feldene Capsules Oxycodone with Aspirin   Bufferin with Codeine Fenoprofen Fenoprofen Pabalate or Pabalate-SF   Buffets II Flogesic Panagesic   Buffinol plain or Extra Strength Florinal or Florinal with Codeine Panwarfarin   Buf-Tabs Flurbiprofen Penicillamine   Butalbital Compound Four-way cold tablets Penicillin   Butazolidin Fragmin Pepto-Bismol   Carbenicillin Geminisyn Percodan   Carna Arthritis Reliever Geopen Persantine   Carprofen Gold's salt Persistin   Chloramphenicol Goody's Phenylbutazone   Chloromycetin Haltrain Piroxlcam   Clmetidine heparin Plaquenil   Cllnoril Hyco-pap Ponstel   Clofibrate Hydroxy chloroquine Propoxyphen         Before stopping any of these  medications, be sure to consult the physician who ordered them.  Some, such as Coumadin (Warfarin) are ordered to prevent or treat serious conditions such as "deep thrombosis", "pumonary embolisms", and other heart problems.  The amount of time that you may need off of the medication may also vary with the medication and the reason for which you were taking it.  If you are taking any of these medications, please make sure you notify your pain physician before you undergo any procedures.

## 2016-11-23 NOTE — Progress Notes (Signed)
Safety precautions to be maintained throughout the outpatient stay will include: orient to surroundings, keep bed in low position, maintain call bell within reach at all times, provide assistance with transfer out of bed and ambulation.  

## 2016-11-24 ENCOUNTER — Telehealth: Payer: Self-pay | Admitting: *Deleted

## 2016-11-24 NOTE — Telephone Encounter (Signed)
No problems post procedure. 

## 2016-12-28 ENCOUNTER — Telehealth: Payer: Self-pay | Admitting: *Deleted

## 2016-12-29 ENCOUNTER — Ambulatory Visit: Admitting: Pain Medicine

## 2017-02-15 ENCOUNTER — Ambulatory Visit: Admitting: Nurse Practitioner

## 2018-01-22 ENCOUNTER — Other Ambulatory Visit: Payer: Self-pay | Admitting: Pain Medicine

## 2018-01-22 DIAGNOSIS — M19012 Primary osteoarthritis, left shoulder: Principal | ICD-10-CM

## 2018-01-22 DIAGNOSIS — M19011 Primary osteoarthritis, right shoulder: Secondary | ICD-10-CM

## 2019-02-02 IMAGING — CR DG LUMBAR SPINE COMPLETE W/ BEND
1 series · 7 of 7 positions shown · non-contrast
Comparison: None

CLINICAL DATA: Chronic low back pain

EXAM:
LUMBAR SPINE - COMPLETE WITH BENDING VIEWS

[Series 1: dg lumbar spine complete w/bend 6+v · 0.14mm/px · 7 of 7 slices shown]
[im 1/7]
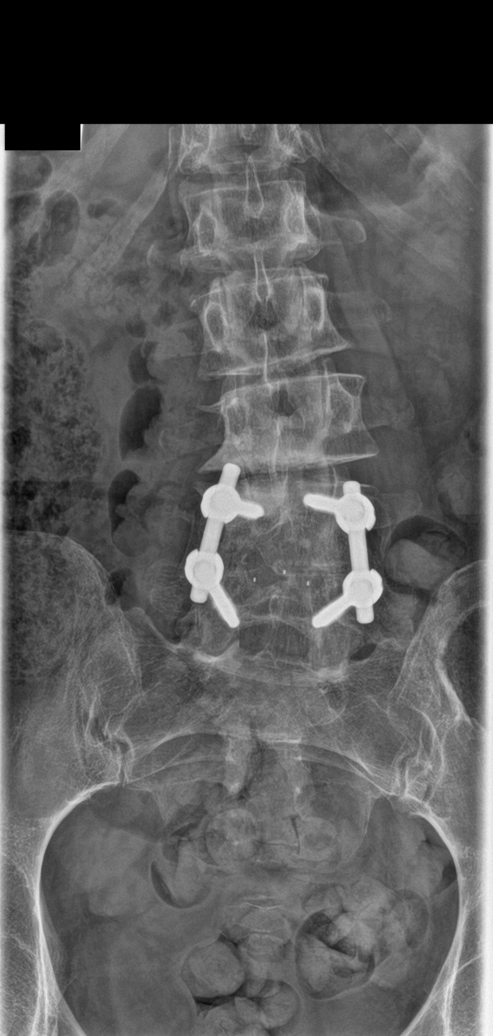
[im 2/7]
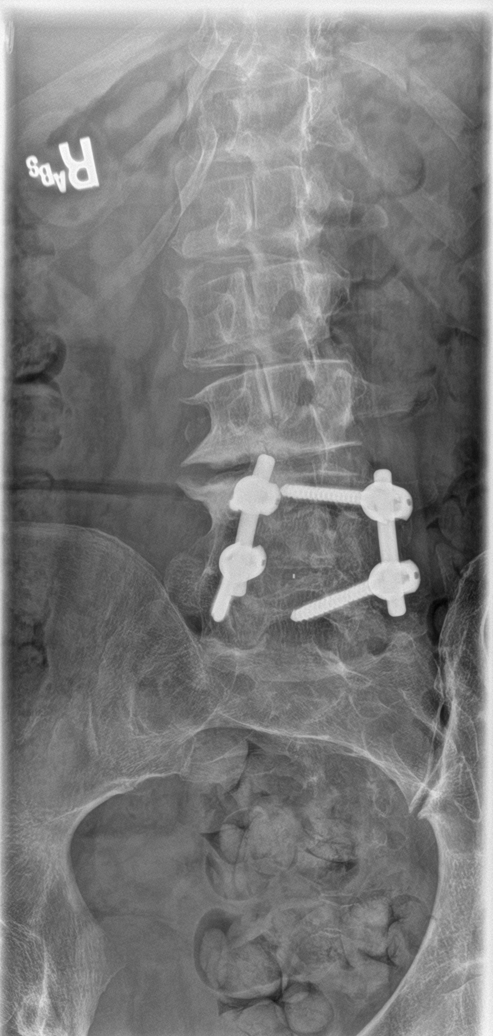
[im 3/7]
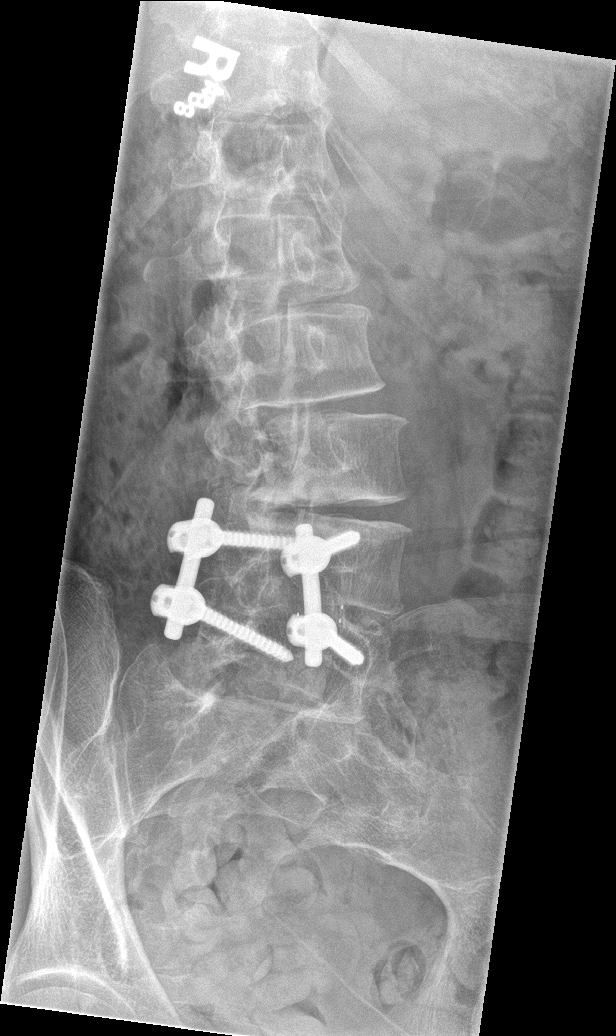
[im 4/7]
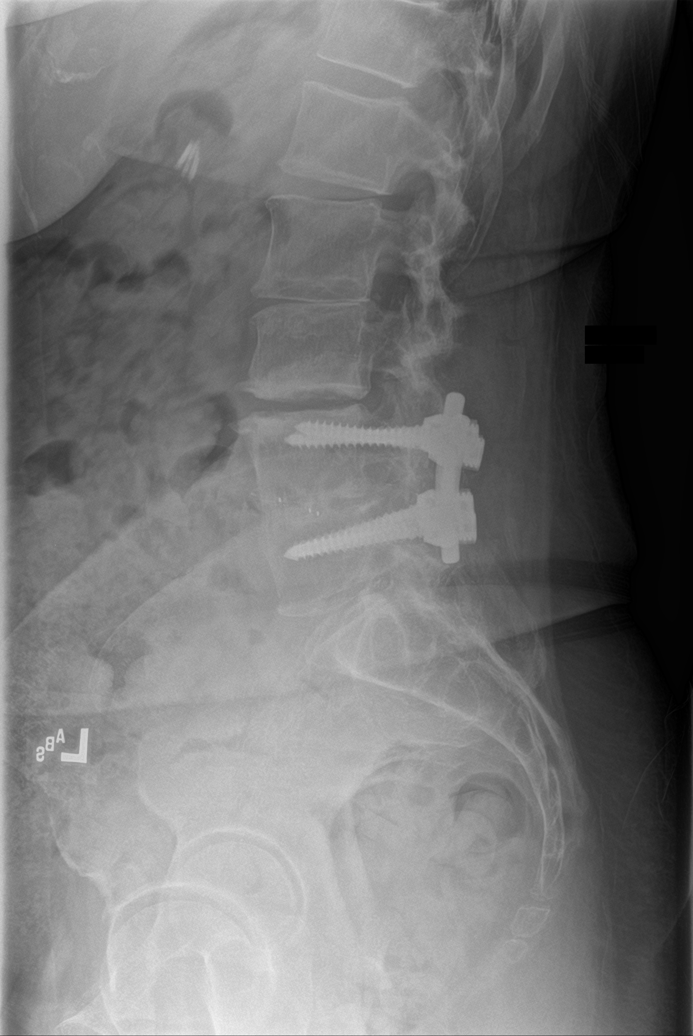
[im 5/7]
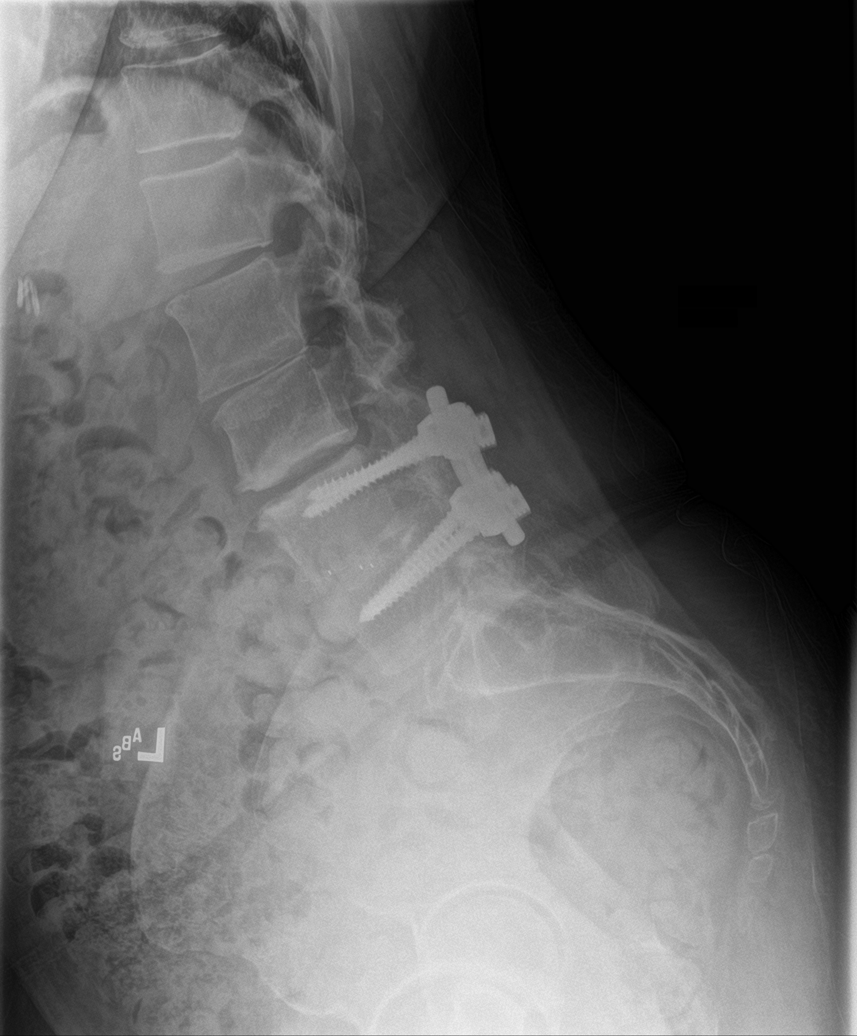
[im 6/7]
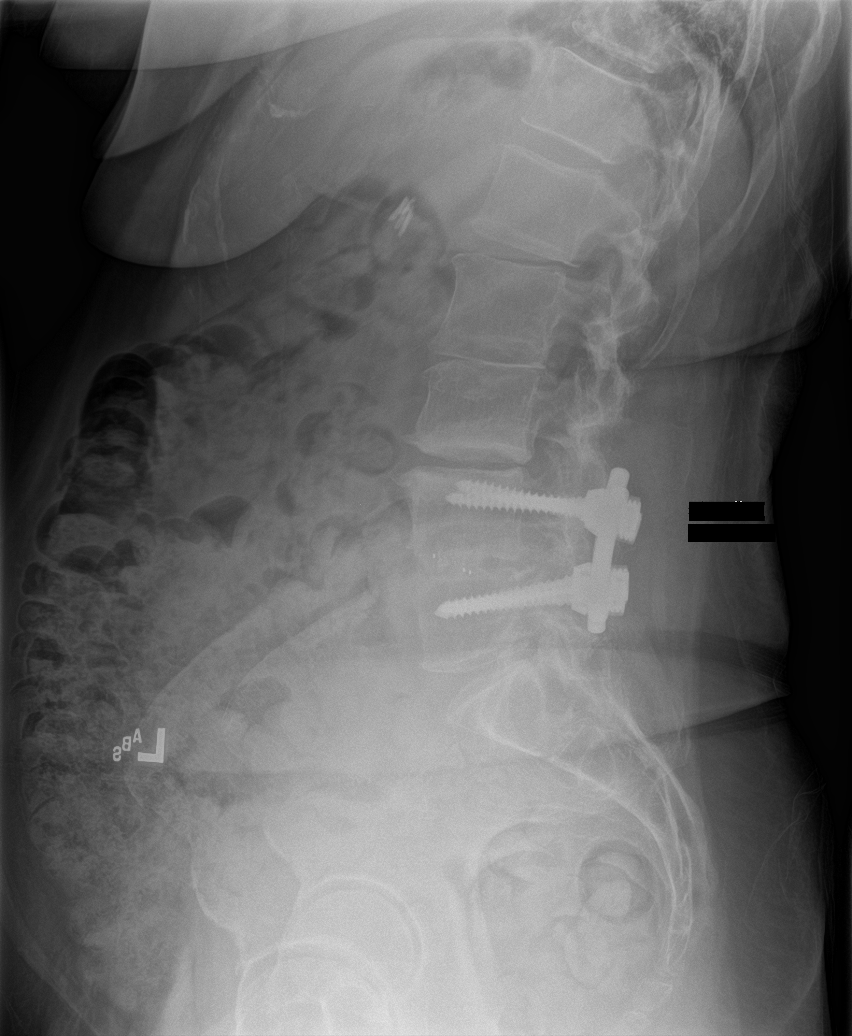
[im 7/7]
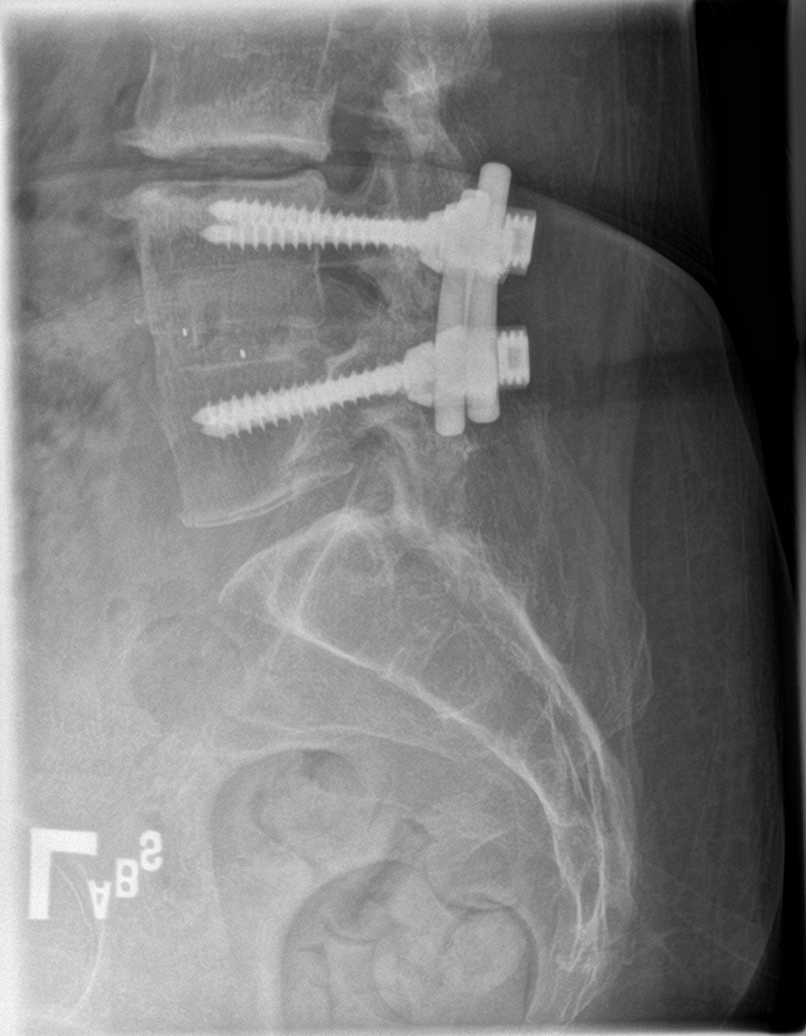

[7 of 7 positions shown; findings below may reference images not displayed]

FINDINGS: Changes of prior posterior fusion at L4-5. No hardware complicating
feature. Mild leftward scoliosis in the mid lumbar spine.
Degenerative disc disease changes, most pronounced at L3-4 with
sclerotic endplates and spurring. No fracture. SI joints are
symmetric and unremarkable. No instability with flexion or
extension.
IMPRESSION: Degenerative and postoperative changes in the lumbar spine. No acute
findings.

## 2019-02-02 IMAGING — CR DG SI JOINTS 3+V
1 series · 3 of 3 positions shown · non-contrast
Comparison: None.

CLINICAL DATA: Chronic SI joint pain

EXAM:
BILATERAL SACROILIAC JOINTS - 3+ VIEW

[Series 1: dg si joints · 0.14mm/px · 3 of 3 slices shown]
[im 1/3]
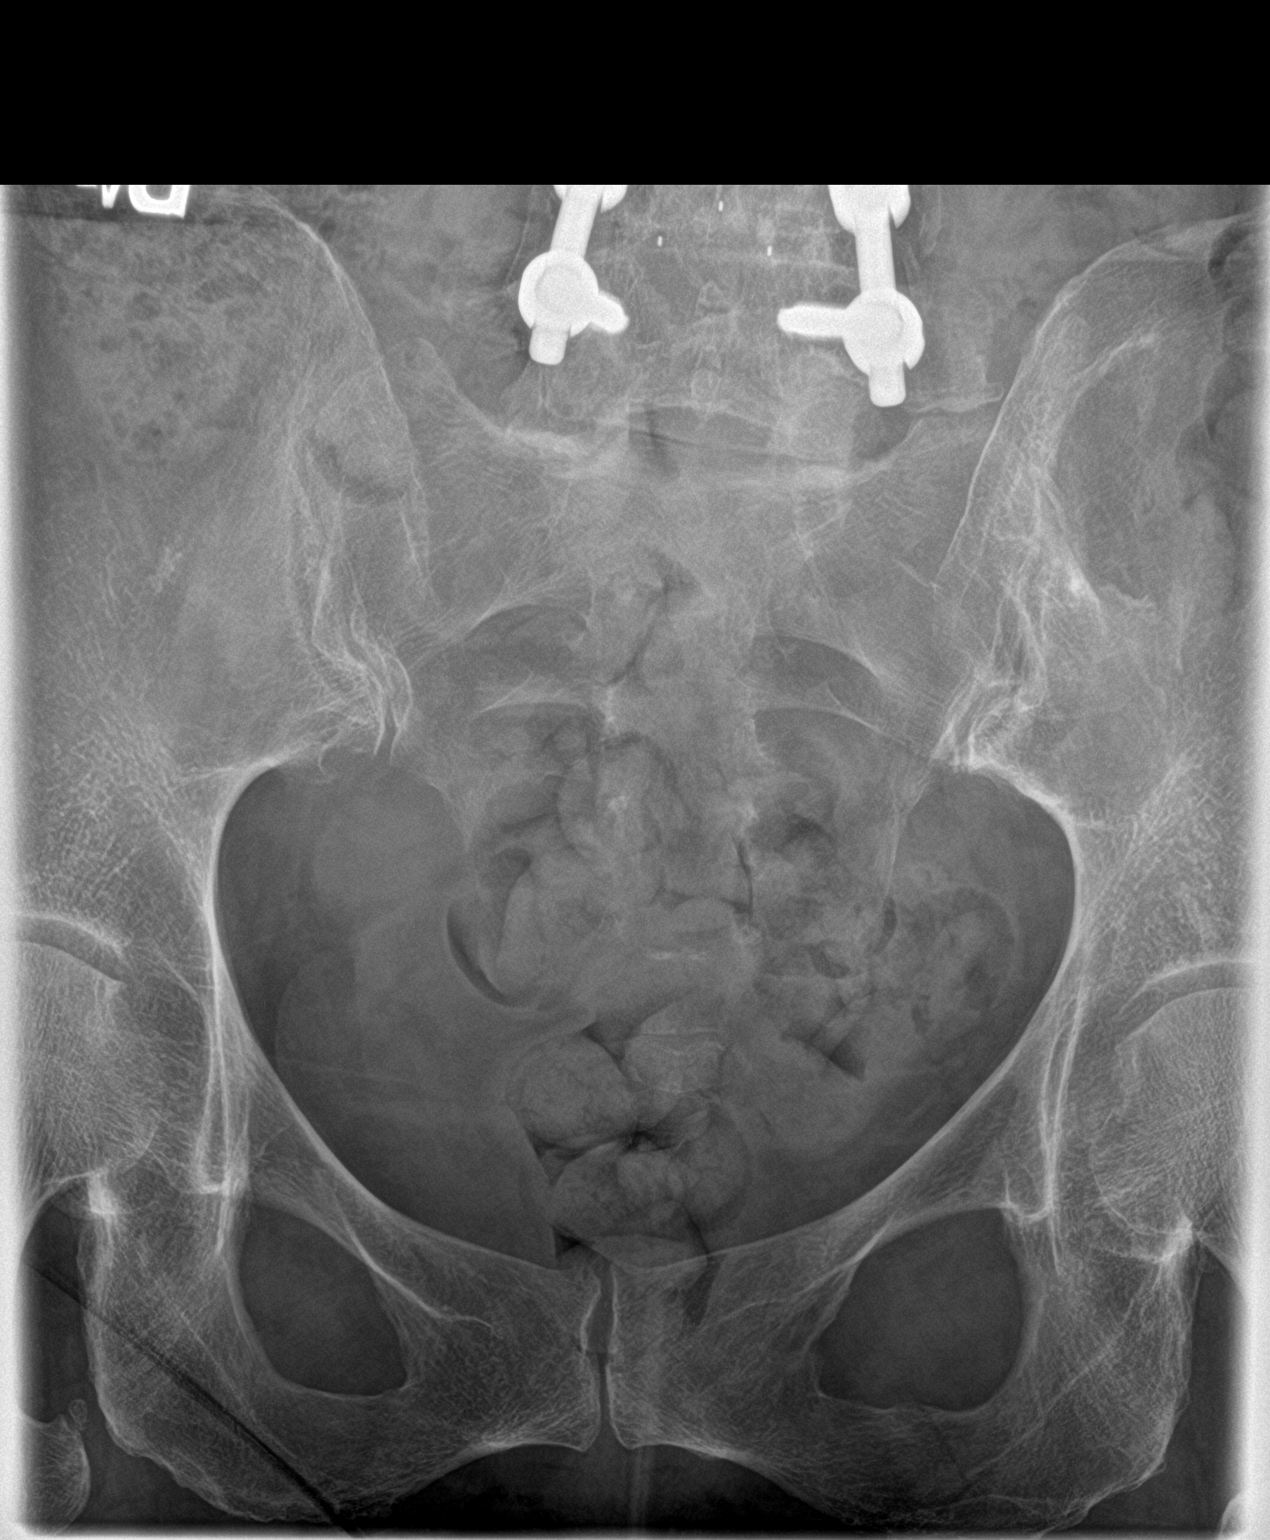
[im 2/3]
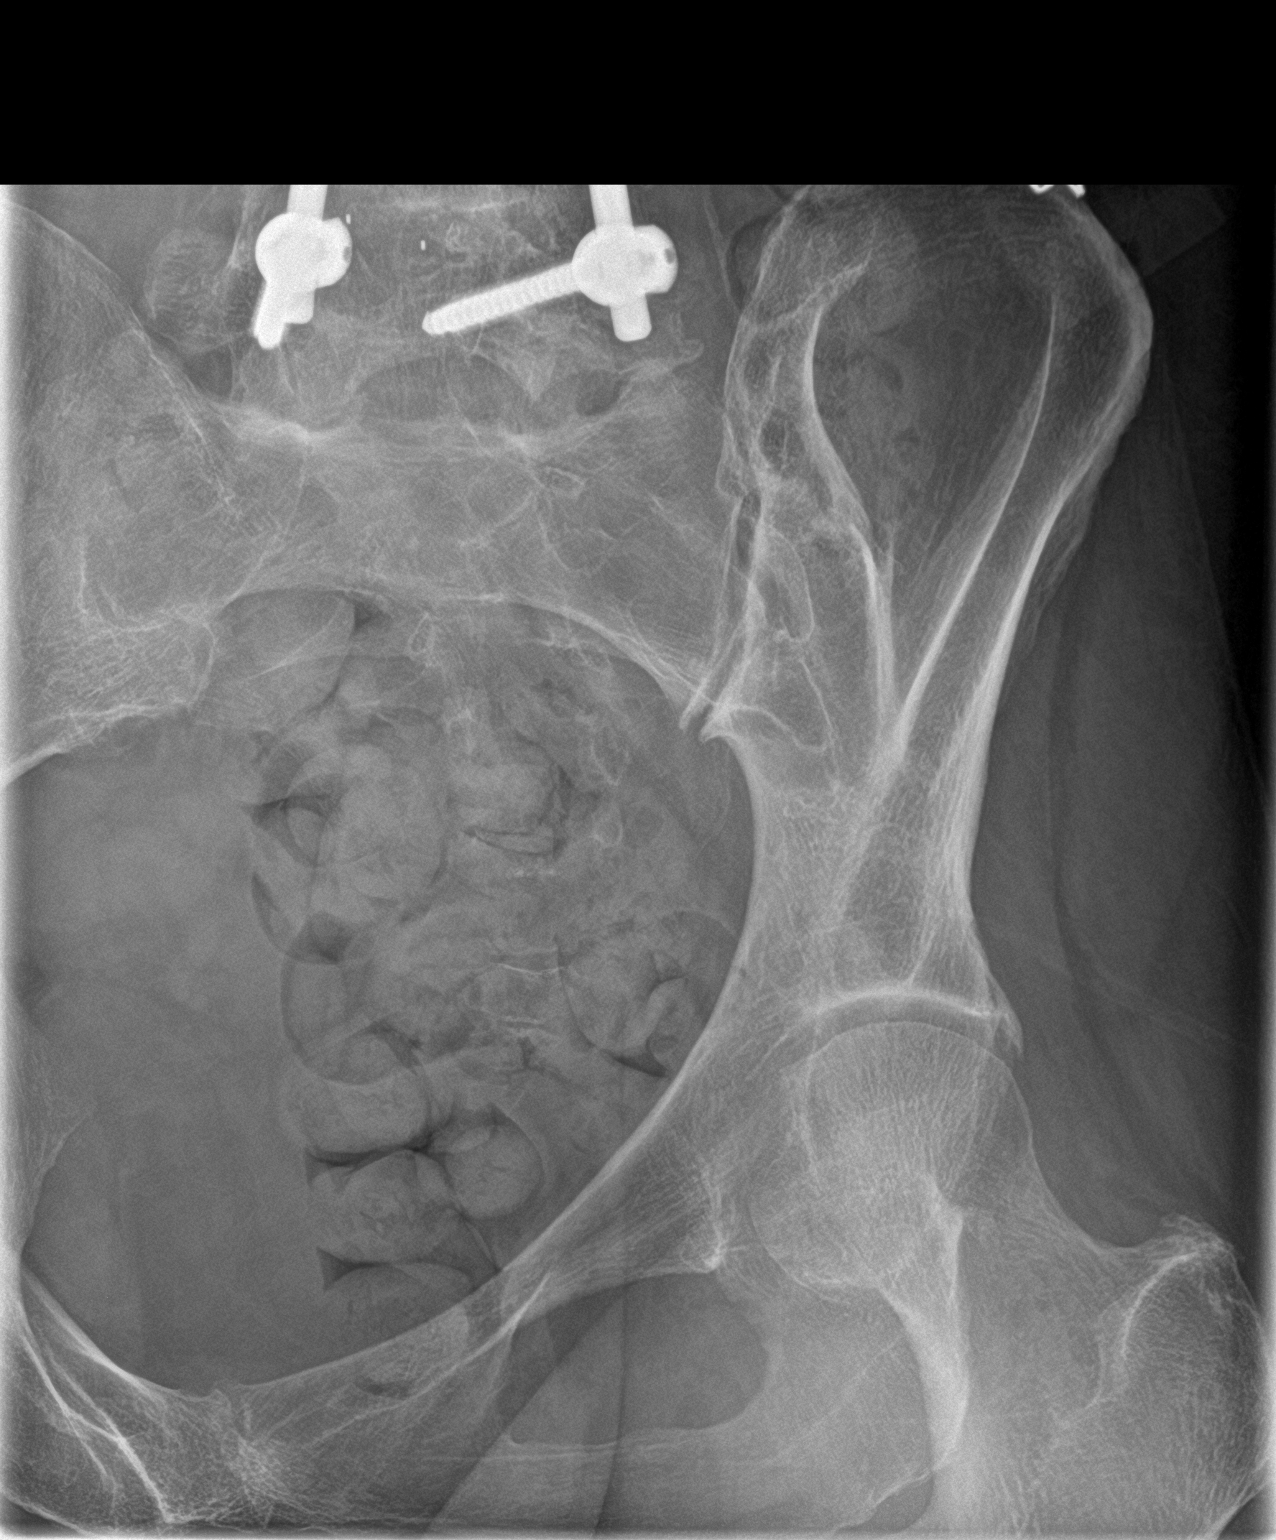
[im 3/3]
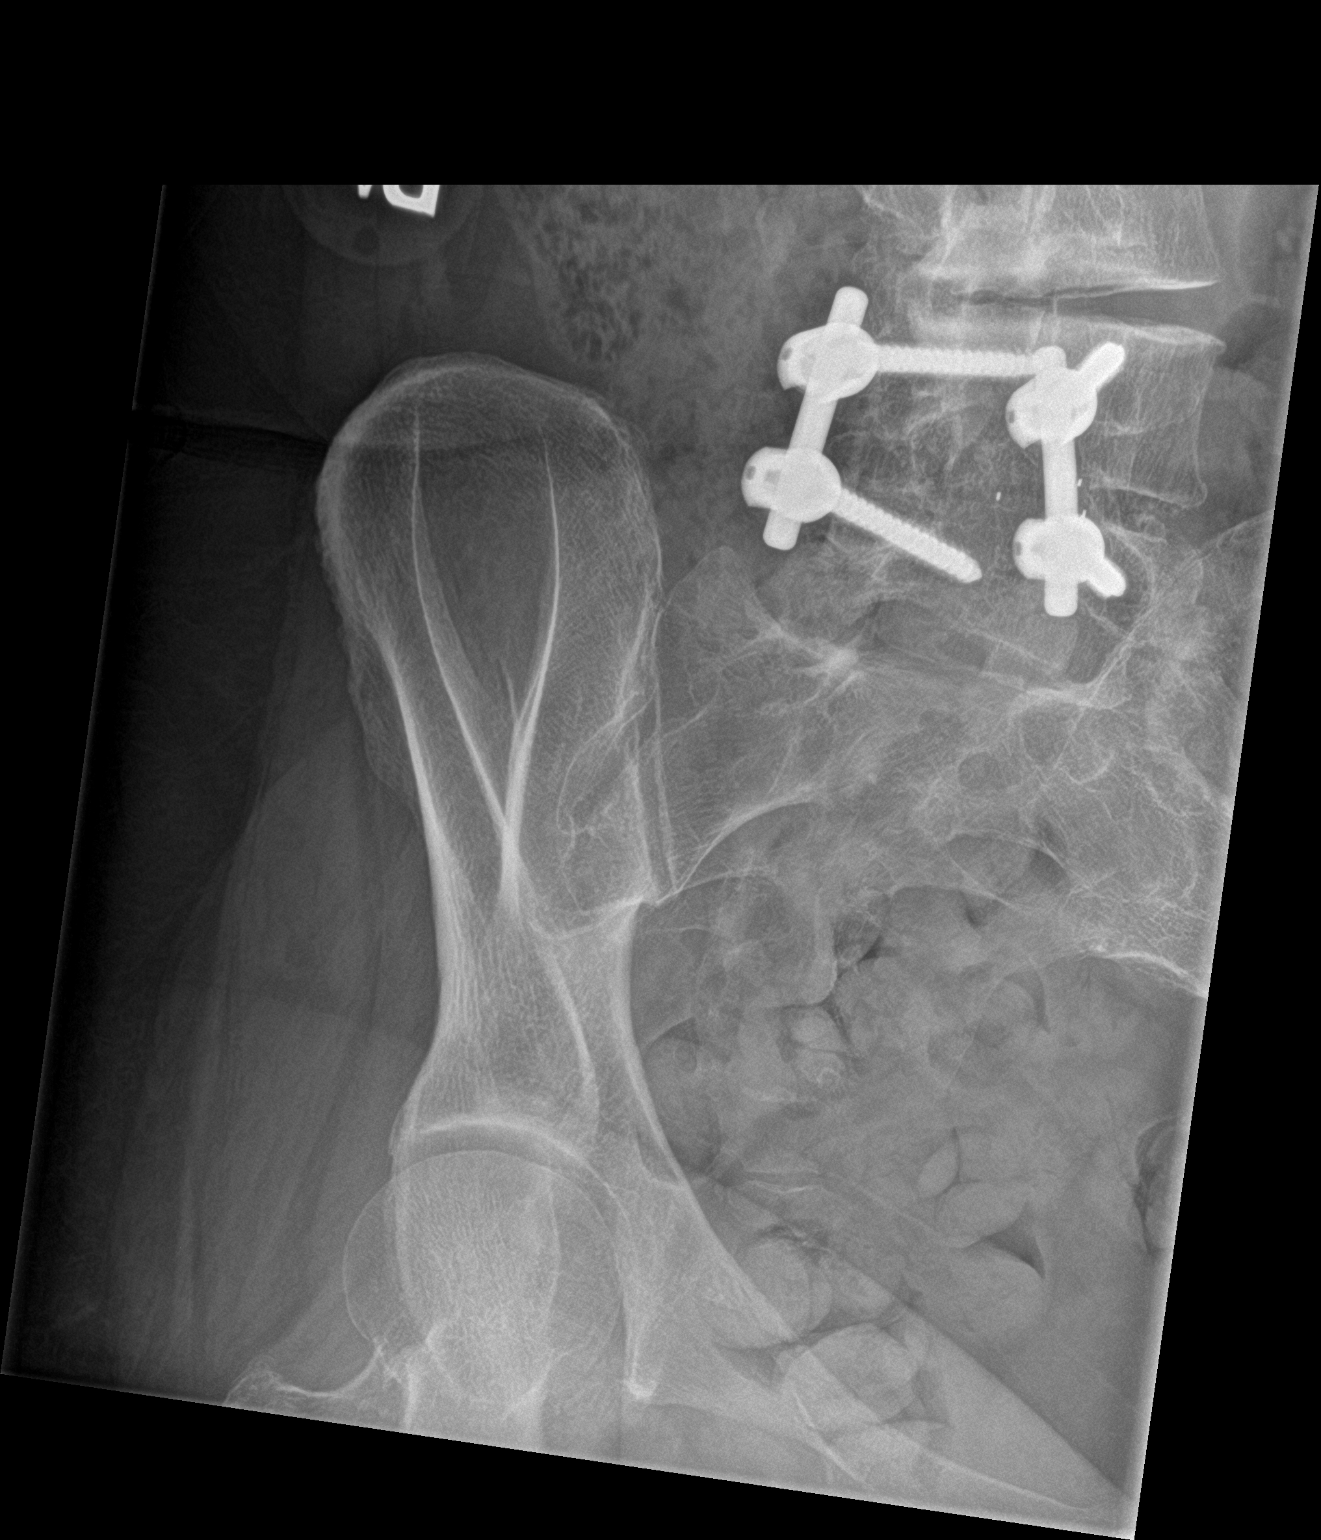

[3 of 3 positions shown; findings below may reference images not displayed]

FINDINGS: Mild sclerosis around the left SI joint could reflect sacroiliitis.
Right SI joint is unremarkable. No acute bony abnormality.
IMPRESSION: Mild sclerosis around the left SI joint may reflect mild
sacroiliitis.

## 2019-07-30 ENCOUNTER — Encounter: Payer: Self-pay | Admitting: Oncology

## 2019-07-30 ENCOUNTER — Other Ambulatory Visit: Payer: Self-pay

## 2019-07-30 ENCOUNTER — Inpatient Hospital Stay

## 2019-07-30 ENCOUNTER — Encounter: Admitting: Oncology

## 2019-07-30 ENCOUNTER — Encounter (INDEPENDENT_AMBULATORY_CARE_PROVIDER_SITE_OTHER): Payer: Self-pay

## 2019-07-30 ENCOUNTER — Inpatient Hospital Stay: Attending: Oncology | Admitting: Oncology

## 2019-07-30 VITALS — BP 118/80 | HR 79 | Temp 96.1°F | Wt 192.2 lb

## 2019-07-30 DIAGNOSIS — Z79899 Other long term (current) drug therapy: Secondary | ICD-10-CM | POA: Diagnosis not present

## 2019-07-30 DIAGNOSIS — D696 Thrombocytopenia, unspecified: Secondary | ICD-10-CM

## 2019-07-30 DIAGNOSIS — D693 Immune thrombocytopenic purpura: Secondary | ICD-10-CM | POA: Diagnosis not present

## 2019-07-30 LAB — CBC WITH DIFFERENTIAL/PLATELET
Abs Immature Granulocytes: 0.01 10*3/uL (ref 0.00–0.07)
Basophils Absolute: 0 10*3/uL (ref 0.0–0.1)
Basophils Relative: 0 %
Eosinophils Absolute: 0 10*3/uL (ref 0.0–0.5)
Eosinophils Relative: 1 %
HCT: 44.8 % (ref 36.0–46.0)
Hemoglobin: 14.9 g/dL (ref 12.0–15.0)
Immature Granulocytes: 0 %
Lymphocytes Relative: 13 %
Lymphs Abs: 0.9 10*3/uL (ref 0.7–4.0)
MCH: 30.7 pg (ref 26.0–34.0)
MCHC: 33.3 g/dL (ref 30.0–36.0)
MCV: 92.2 fL (ref 80.0–100.0)
Monocytes Absolute: 0.5 10*3/uL (ref 0.1–1.0)
Monocytes Relative: 7 %
Neutro Abs: 5.2 10*3/uL (ref 1.7–7.7)
Neutrophils Relative %: 79 %
Platelets: 117 10*3/uL — ABNORMAL LOW (ref 150–400)
RBC: 4.86 MIL/uL (ref 3.87–5.11)
RDW: 11.9 % (ref 11.5–15.5)
WBC: 6.6 10*3/uL (ref 4.0–10.5)
nRBC: 0 % (ref 0.0–0.2)

## 2019-07-30 LAB — PATHOLOGIST SMEAR REVIEW

## 2019-07-30 LAB — VITAMIN B12: Vitamin B-12: 331 pg/mL (ref 180–914)

## 2019-07-30 LAB — FOLATE: Folate: 40 ng/mL (ref >5.9)

## 2019-07-30 LAB — HIV ANTIBODY (ROUTINE TESTING W REFLEX): HIV Screen 4th Generation wRfx: NONREACTIVE

## 2019-07-30 LAB — HEPATITIS C ANTIBODY: HCV Ab: NONREACTIVE

## 2019-07-30 NOTE — Progress Notes (Signed)
Pt states a long time ago she was worked up for plt issue and they determined that it was from a medication and she does not remember what the name of the  Drug was and now she is set back up for services to manage the plt count

## 2019-08-01 ENCOUNTER — Encounter: Payer: Self-pay | Admitting: Oncology

## 2019-08-01 NOTE — Progress Notes (Signed)
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  She would like to reestablish follow-up with hematology.  Currently denies any bleeding or bruising.  Appetite and weight have been stable.  No recent changes in her medications other than gralise and Lexapro  ECOG PS- 0  Pain scale- 0   Review of systems- Review of Systems  Constitutional: Negative for chills, fever, malaise/fatigue and weight loss.  HENT: Negative for congestion, ear discharge and nosebleeds.   Eyes: Negative for blurred vision.  Respiratory: Negative for cough, hemoptysis, sputum production, shortness of breath and wheezing.   Cardiovascular: Negative for chest pain, palpitations, orthopnea and claudication.  Gastrointestinal: Negative for abdominal pain, blood in stool, constipation, diarrhea, heartburn, melena, nausea and vomiting.  Genitourinary: Negative for dysuria, flank pain, frequency, hematuria and urgency.  Musculoskeletal: Negative for back pain, joint pain and myalgias.  Skin: Negative for rash.  Neurological: Negative for dizziness, tingling, focal weakness, seizures, weakness and headaches.  Endo/Heme/Allergies: Does not bruise/bleed easily.  Psychiatric/Behavioral: Negative for depression and suicidal ideas. The patient does not have insomnia.     Allergies  Allergen Reactions  . Penicillins Rash    Patient Active Problem List   Diagnosis Date Noted  . Chronic midline posterior neck pain  (Bilateral) (R>L) 11/23/2016  . Cervical radiculitis (Bilateral) (R>L) 11/23/2016  . 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 Chronic pain syndrome 10/11/2016  . Long term (current) use of opiate analgesic 10/11/2016  . Long term prescription opiate use 10/11/2016  . Opiate use 10/11/2016  . Chronic upper extremity pain (Location of Primary Source of Pain) (Bilateral) (L>R) 10/11/2016  . Chronic low back pain (Location of Secondary source of pain) (Bilateral) (R>L) 10/11/2016  . Chronic shoulder pain (Location of Tertiary source of pain) (Bilateral) (R>L) 10/11/2016  . Chronic sacroiliac joint pain (Right) 10/11/2016  . Chronic hip pain (Bilateral) (R>L) 10/11/2016  . Lumbar facet syndrome (Bilateral) (R>L) 10/11/2016  . Depression 10/21/2015  . Recurrent UTI 09/02/2014  . Vaginal atrophy 09/02/2014  . Thrombocytopenia ([place]) 08/29/2013  . OAB (overactive bladder) 08/23/2013  . Fibromyalgia 11/20/2012     Past Medical History:  Diagnosis Date  . Arthritis   . Depression   . Fibromyalgia      Past Surgical History:  Procedure Laterality Date  . ABDOMINAL HYSTERECTOMY    . BLADDER SURGERY    . CHOLECYSTECTOMY    . prolapse colon      Social History   Socioeconomic History  . Marital status: Married    Spouse name: Not on file  . Number of children: Not on file  . Years of education: Not on file  . Highest education level: Not on file  Occupational History  . Not on file  Tobacco Use  . Smoking status: Never Smoker  . Smokeless tobacco: Never Used  Substance and Sexual Activity  . Alcohol use: Yes    Comment: occassional   . Drug use: No  . Sexual activity: Not Currently  Other Topics Concern  . Not on file  Social History Narrative  . Not on file   Social Determinants of Health   Financial Resource Strain:   . Difficulty of Paying Living Expenses: Not on file  Food Insecurity:   . Worried About Programme researcher, broadcasting/film/video in the Last Year: Not  on file  . Ran Out of Food in the Last Year: Not on file  Transportation Needs:   . Lack of Transportation (Medical): Not on file  . Lack of Transportation (Non-Medical): Not on file  Physical Activity:   . Days of Exercise per Week: Not on file  . Minutes of Exercise per Session: Not on file  Stress:   . Feeling of Stress : Not on file  Social Connections:   . Frequency of Communication with Friends and Family: Not on file  . Frequency of Social Gatherings with Friends and Family: Not on file  . Attends Religious Services: Not on file  . Active Member of Clubs or Organizations: Not on file  . Attends Banker Meetings: Not on file  . Marital Status: Not on file  Intimate Partner Violence:   . Fear of Current or Ex-Partner: Not on file  . Emotionally Abused: Not on file  . Physically Abused: Not on file  . Sexually Abused: Not on file     History reviewed. No pertinent family history.   Current Outpatient Medications:  .  Calcium-Magnesium 300-300 MG TABS, Take 1 tablet by mouth daily., Disp: , Rfl:  .  celecoxib (CELEBREX) 100 MG capsule, Take 1 capsule (100 mg total) by mouth 2 (two) times daily., Disp: 60 capsule, Rfl: 2 .  cholecalciferol (VITAMIN D) 1000 units tablet, Take 1,000 Units by mouth daily. , Disp: , Rfl:  .  DULoxetine (CYMBALTA) 60 MG capsule, Take 1 capsule (60 mg total) by mouth daily., Disp: 30 capsule, Rfl: 2 .  escitalopram (LEXAPRO) 20 MG tablet, Take 20 mg by mouth daily., Disp: , Rfl:  .  folic acid (FOLVITE) 400 MCG tablet, Take 400 mcg by mouth daily., Disp: , Rfl:  .  GRALISE 600 MG TABS, Take 600 mg by mouth at bedtime. (Patient taking differently: Take 1,800 mg by mouth at bedtime. ), Disp: 30 tablet, Rfl: 2 .  Lutein 10 MG TABS, Take 10 mg by mouth daily., Disp: , Rfl:  .  Medium Chain Triglycerides (MCT OIL PO), Take 1 tablet by mouth daily., Disp: , Rfl:  .  Omega-3 Fatty Acids (OMEGA-3 FISH OIL PO), Take 1 capsule by mouth daily.,  Disp: , Rfl:  .  omeprazole (PRILOSEC) 20 MG capsule, Take 20 mg by mouth daily., Disp: , Rfl:  .  traMADol (ULTRAM-ER) 200 MG 24 hr tablet, Take 200 mg by mouth daily., Disp: , Rfl:  .  Turmeric (QC TUMERIC COMPLEX PO), Take 1 tablet by mouth daily., Disp: , Rfl:  .  traMADol (ULTRAM) 50 MG tablet, Take 1 tablet (50 mg total) by mouth 2 (two) times daily., Disp: 60 tablet, Rfl: 2   Physical exam:  Vitals:   07/30/19 1501 07/30/19 1504  BP: 118/80   Pulse: 79   Temp: (!) 96.1 F (35.6 C)   TempSrc: Tympanic   Weight:  192 lb 3.2 oz (87.2 kg)   Physical Exam Constitutional:      General: She is not in acute distress. HENT:     Head: Normocephalic and atraumatic.  Eyes:     Pupils: Pupils are equal, round, and reactive to light.  Cardiovascular:  Rate and Rhythm: Normal rate and regular rhythm.     Heart sounds: Normal heart sounds.  Pulmonary:     Effort: Pulmonary effort is normal.     Breath sounds: Normal breath sounds.  Abdominal:     General: Bowel sounds are normal.     Palpations: Abdomen is soft.     Comments: No palpable splenomegaly  Musculoskeletal:     Cervical back: Normal range of motion.  Skin:    General: Skin is warm and dry.  Neurological:     Mental Status: She is alert and oriented to person, place, and time.        CMP Latest Ref Rng & Units 10/25/2016  Glucose 65 - 99 mg/dL 96  BUN 6 - 20 mg/dL 20  Creatinine 5.42 - 7.06 mg/dL 2.37  Sodium 628 - 315 mmol/L 138  Potassium 3.5 - 5.1 mmol/L 4.1  Chloride 101 - 111 mmol/L 101  CO2 22 - 32 mmol/L 29  Calcium 8.9 - 10.3 mg/dL 9.3  Total Protein 6.5 - 8.1 g/dL 7.0  Total Bilirubin 0.3 - 1.2 mg/dL 0.6  Alkaline Phos 38 - 126 U/L 89  AST 15 - 41 U/L 23  ALT 14 - 54 U/L 20   CBC Latest Ref Rng & Units 07/30/2019  WBC 4.0 - 10.5 K/uL 6.6  Hemoglobin 12.0 - 15.0 g/dL 17.6  Hematocrit 16.0 - 46.0 % 44.8  Platelets 150 - 400 K/uL 117(L)     Assessment and plan- Patient is a 64 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  Today I will get a CBC with differential, HIV, hepatitis C testing along with B12 and folate.  As long as platelet counts are more than 30 she does not require any treatment for ITP.  I will see her back in 1 week for a video visit to discuss her labs and further management   Thank you for this kind referral and the opportunity to participate in the care of this patient   Visit Diagnosis 1. Thrombocytopenia (HCC)     Dr. Owens Shark, MD, MPH [facility] [phone] 08/01/2019 5:01 PM

## 2019-08-05 ENCOUNTER — Other Ambulatory Visit: Payer: Self-pay

## 2019-08-06 ENCOUNTER — Inpatient Hospital Stay (HOSPITAL_BASED_OUTPATIENT_CLINIC_OR_DEPARTMENT_OTHER): Admitting: Oncology

## 2019-08-06 ENCOUNTER — Encounter: Payer: Self-pay | Admitting: Oncology

## 2019-08-06 DIAGNOSIS — D693 Immune thrombocytopenic purpura: Secondary | ICD-10-CM

## 2019-08-06 NOTE — Progress Notes (Signed)
Go over results of lab tests. No concerns

## 2019-08-09 NOTE — Progress Notes (Signed)
I connected with Sarah Morales on 08/09/19 at 10:00 AM EST by video enabled telemedicine visit and verified that I am speaking with the correct person using two identifiers.   I discussed the limitations, risks, security and privacy concerns of performing an evaluation and management service by telemedicine and the availability of in-person appointments. I also discussed with the patient that there may be a patient responsible charge related to this service. The patient expressed understanding and agreed to proceed.  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  She would like to reestablish follow-up with hematology.  Currently denies any bleeding or bruising.  Appetite and weight have been stable.  No recent changes in her medications other than gralise and Lexapro  Results of blood work from 07/30/2019 showed white cell count of 6.6, H&H of 14.9/44.8 and a platelet count of 117.  B12 and folate were normal.  HIV and hep C testing was negative.  Smear review was unremarkable.  Interval history: Patient is doing well overall and denies any symptoms of bleeding or bruising.   Review of Systems  Constitutional: Negative for chills, fever, malaise/fatigue and weight loss.  HENT: Negative for congestion, ear discharge and nosebleeds.   Eyes: Negative for blurred vision.  Respiratory: Negative for cough, hemoptysis, sputum production, shortness of breath and wheezing.   Cardiovascular: Negative for chest pain, palpitations, orthopnea and claudication.  Gastrointestinal: Negative for abdominal pain,  blood in stool, constipation, diarrhea, heartburn, melena, nausea and vomiting.  Genitourinary: Negative for dysuria, flank pain, frequency, hematuria and urgency.  Musculoskeletal: Negative for back pain, joint pain and myalgias.  Skin: Negative for rash.  Neurological: Negative for dizziness, tingling, focal weakness, seizures, weakness and headaches.  Endo/Heme/Allergies: Does not bruise/bleed easily.  Psychiatric/Behavioral: Negative for depression and suicidal ideas. The patient does not have insomnia.     Allergies  Allergen Reactions  . Penicillins Rash    Past Medical History:  Diagnosis Date  . Arthritis   . Depression   . Fibromyalgia     Past Surgical History:  Procedure Laterality Date  . ABDOMINAL HYSTERECTOMY    . BLADDER SURGERY    . CHOLECYSTECTOMY    . prolapse colon      Social History   Socioeconomic History  . Marital status: Married    Spouse name: Not on file  . Number of children: Not on file  . Years of education: Not on file  . Highest education level: Not on file  Occupational History  . Not on file  Tobacco Use  . Smoking status: Never Smoker  . Smokeless tobacco: Never Used  Substance and Sexual Activity  . Alcohol use: Yes    Comment: occassional   . Drug use: No  . Sexual activity: Not Currently  Other Topics Concern  . Not on file  Social History Narrative  . Not on file   Social Determinants of Health   Financial Resource Strain:   . Difficulty of Paying Living Expenses: Not on file  Food Insecurity:   . Worried About Charity fundraiser in the Last Year: Not on file  . Ran  Out of Food in the Last Year: Not on file  Transportation Needs:   . Lack of Transportation (Medical): Not on file  . Lack of Transportation (Non-Medical): Not on file  Physical Activity:   . Days of Exercise per Week: Not on file  . Minutes of Exercise per Session: Not on file  Stress:   . Feeling of Stress : Not on file  Social Connections:   .  Frequency of Communication with Friends and Family: Not on file  . Frequency of Social Gatherings with Friends and Family: Not on file  . Attends Religious Services: Not on file  . Active Member of Clubs or Organizations: Not on file  . Attends Banker Meetings: Not on file  . Marital Status: Not on file  Intimate Partner Violence:   . Fear of Current or Ex-Partner: Not on file  . Emotionally Abused: Not on file  . Physically Abused: Not on file  . Sexually Abused: Not on file    History reviewed. No pertinent family history.   Current Outpatient Medications:  .  Calcium-Magnesium 300-300 MG TABS, Take 1 tablet by mouth daily., Disp: , Rfl:  .  celecoxib (CELEBREX) 100 MG capsule, Take 1 capsule (100 mg total) by mouth 2 (two) times daily., Disp: 60 capsule, Rfl: 2 .  cholecalciferol (VITAMIN D) 1000 units tablet, Take 1,000 Units by mouth daily. , Disp: , Rfl:  .  DULoxetine (CYMBALTA) 60 MG capsule, Take 1 capsule (60 mg total) by mouth daily., Disp: 30 capsule, Rfl: 2 .  escitalopram (LEXAPRO) 20 MG tablet, Take 20 mg by mouth daily., Disp: , Rfl:  .  folic acid (FOLVITE) 400 MCG tablet, Take 400 mcg by mouth daily., Disp: , Rfl:  .  GRALISE 600 MG TABS, Take 600 mg by mouth at bedtime. (Patient taking differently: Take 1,800 mg by mouth at bedtime. ), Disp: 30 tablet, Rfl: 2 .  Lutein 10 MG TABS, Take 10 mg by mouth daily., Disp: , Rfl:  .  Medium Chain Triglycerides (MCT OIL PO), Take 1 tablet by mouth daily., Disp: , Rfl:  .  Omega-3 Fatty Acids (OMEGA-3 FISH OIL PO), Take 1 capsule by mouth daily., Disp: , Rfl:  .  omeprazole (PRILOSEC) 20 MG capsule, Take 20 mg by mouth daily., Disp: , Rfl:  .  traMADol (ULTRAM) 50 MG tablet, Take 1 tablet (50 mg total) by mouth 2 (two) times daily., Disp: 60 tablet, Rfl: 2 .  traMADol (ULTRAM-ER) 200 MG 24 hr tablet, Take 200 mg by mouth daily., Disp: , Rfl:  .  Turmeric (QC TUMERIC COMPLEX PO), Take 1 tablet by mouth daily., Disp:  , Rfl:   No results found.  No images are attached to the encounter.   CMP Latest Ref Rng & Units 10/25/2016  Glucose 65 - 99 mg/dL 96  BUN 6 - 20 mg/dL 20  Creatinine 5.36 - 6.44 mg/dL 0.34  Sodium 742 - 595 mmol/L 138  Potassium 3.5 - 5.1 mmol/L 4.1  Chloride 101 - 111 mmol/L 101  CO2 22 - 32 mmol/L 29  Calcium 8.9 - 10.3 mg/dL 9.3  Total Protein 6.5 - 8.1 g/dL 7.0  Total Bilirubin 0.3 - 1.2 mg/dL 0.6  Alkaline Phos 38 - 126 U/L 89  AST 15 - 41 U/L 23  ALT 14 - 54 U/L 20   CBC Latest Ref Rng & Units 07/30/2019  WBC 4.0 - 10.5 K/uL 6.6  Hemoglobin 12.0 - 15.0 g/dL 63.8  Hematocrit  36.0 - 46.0 % 44.8  Platelets 150 - 400 K/uL 117(L)     Observation/objective: Appears in no acute distress over video visit today.  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  She has had a low platelet count for a while and I suspect this is secondary to ITP.  B12, folate, HIV and hep C results are unremarkable.  Smear review is unremarkable.  This can be monitored conservatively and she does not require any treatment for ITP unless her platelet counts are less than 30.  I also counseled the patient that if she decides to take the Covid vaccine it is possible that she may have a transient decrease in her platelet count.  [employer] still recommends Covid vaccination in people who has a history of ITP as benefits of the vaccine potentially outweigh the risks.  I will be happy to monitor her platelet counts for a few weeks after she receives her vaccine to make sure that we do not drop precipitously.  Patient is agreeable  Follow-up instructions: CBC with differential followed by video visit in 6 months I discussed the assessment and treatment plan with the patient. The patient was provided an opportunity to ask  questions and all were answered. The patient agreed with the plan and demonstrated an understanding of the instructions.   The patient was advised to call back or seek an in-person evaluation if the symptoms worsen or if the condition fails to improve as anticipated.    Visit Diagnosis: 1. Chronic ITP (idiopathic thrombocytopenia) (HCC)     Dr. Owens Shark, MD, MPH [facility] at [facility] Tel- [phone] 08/09/2019 8:30 AM

## 2020-01-31 ENCOUNTER — Other Ambulatory Visit: Payer: Self-pay | Admitting: Physician Assistant

## 2020-01-31 DIAGNOSIS — M545 Low back pain, unspecified: Secondary | ICD-10-CM

## 2020-02-03 ENCOUNTER — Inpatient Hospital Stay

## 2020-02-04 ENCOUNTER — Inpatient Hospital Stay: Admitting: Oncology

## 2020-02-04 ENCOUNTER — Other Ambulatory Visit: Payer: Self-pay

## 2020-02-04 ENCOUNTER — Inpatient Hospital Stay: Attending: Oncology

## 2020-02-04 DIAGNOSIS — D696 Thrombocytopenia, unspecified: Secondary | ICD-10-CM | POA: Insufficient documentation

## 2020-02-04 DIAGNOSIS — Z79899 Other long term (current) drug therapy: Secondary | ICD-10-CM | POA: Insufficient documentation

## 2020-02-04 DIAGNOSIS — D693 Immune thrombocytopenic purpura: Secondary | ICD-10-CM

## 2020-02-04 LAB — CBC WITH DIFFERENTIAL/PLATELET
Abs Immature Granulocytes: 0.02 10*3/uL (ref 0.00–0.07)
Basophils Absolute: 0 10*3/uL (ref 0.0–0.1)
Basophils Relative: 1 %
Eosinophils Absolute: 0.1 10*3/uL (ref 0.0–0.5)
Eosinophils Relative: 2 %
HCT: 43 % (ref 36.0–46.0)
Hemoglobin: 14.8 g/dL (ref 12.0–15.0)
Immature Granulocytes: 0 %
Lymphocytes Relative: 22 %
Lymphs Abs: 1.1 10*3/uL (ref 0.7–4.0)
MCH: 31.3 pg (ref 26.0–34.0)
MCHC: 34.4 g/dL (ref 30.0–36.0)
MCV: 90.9 fL (ref 80.0–100.0)
Monocytes Absolute: 0.6 10*3/uL (ref 0.1–1.0)
Monocytes Relative: 11 %
Neutro Abs: 3.1 10*3/uL (ref 1.7–7.7)
Neutrophils Relative %: 64 %
Platelets: 103 10*3/uL — ABNORMAL LOW (ref 150–400)
RBC: 4.73 MIL/uL (ref 3.87–5.11)
RDW: 12.2 % (ref 11.5–15.5)
WBC: 4.9 10*3/uL (ref 4.0–10.5)
nRBC: 0 % (ref 0.0–0.2)

## 2020-02-06 ENCOUNTER — Encounter: Payer: Self-pay | Admitting: Oncology

## 2020-02-06 ENCOUNTER — Inpatient Hospital Stay (HOSPITAL_BASED_OUTPATIENT_CLINIC_OR_DEPARTMENT_OTHER): Admitting: Oncology

## 2020-02-06 DIAGNOSIS — D696 Thrombocytopenia, unspecified: Secondary | ICD-10-CM

## 2020-02-06 NOTE — Progress Notes (Signed)
I connected with Sarah Morales on 02/06/20 at  8:45 AM EDT by video enabled telemedicine visit and verified that I am speaking with the correct person using two identifiers.   I discussed the limitations, risks, security and privacy concerns of performing an evaluation and management service by telemedicine and the availability of in-person appointments. I also discussed with the patient that there may be a patient responsible charge related to this service. The patient expressed understanding and agreed to proceed.  Other persons participating in the visit and their role in the encounter:  none  Patient's location:  home Provider's location:  work   Diagnosis-thrombocytopenia likely secondary to ITP  Chief complaint/ Reason for visit-routine follow-up of thrombocytopenia  Heme/Onc history: Patient is a 64 year old female with prior history of ITP for many years which has not required any treatment so far and has been conservatively monitored. She would like to reestablish follow-up with hematology. Currently denies any bleeding or bruising. Appetite and weight have been stable.No recent changes in her medications other than gralise andLexapro  Results of blood work from 07/30/2019 showed white cell count of 6.6, H&H of 14.9/44.8 and a platelet count of 117.  B12 and folate were normal.  HIV and hep C testing was negative.  Smear review was unremarkable.  Interval history: Patient reports doing well.  Denies any complaints presently.  Denies any bleeding or bruising.  Denies any nosebleeds, gum bleeds or vaginal bleeding   Review of Systems  Constitutional: Negative for chills, fever, malaise/fatigue and weight loss.  HENT: Negative for congestion, ear discharge and nosebleeds.   Eyes: Negative for blurred vision.  Respiratory: Negative for cough, hemoptysis, sputum production, shortness of breath and wheezing.   Cardiovascular: Negative for chest pain, palpitations, orthopnea and  claudication.  Gastrointestinal: Negative for abdominal pain, blood in stool, constipation, diarrhea, heartburn, melena, nausea and vomiting.  Genitourinary: Negative for dysuria, flank pain, frequency, hematuria and urgency.  Musculoskeletal: Negative for back pain, joint pain and myalgias.  Skin: Negative for rash.  Neurological: Negative for dizziness, tingling, focal weakness, seizures, weakness and headaches.  Endo/Heme/Allergies: Does not bruise/bleed easily.  Psychiatric/Behavioral: Negative for depression and suicidal ideas. The patient does not have insomnia.     Allergies  Allergen Reactions   Penicillins Rash    Past Medical History:  Diagnosis Date   Arthritis    Depression    Fibromyalgia     Past Surgical History:  Procedure Laterality Date   ABDOMINAL HYSTERECTOMY     BLADDER SURGERY     CHOLECYSTECTOMY     prolapse colon      Social History   Socioeconomic History   Marital status: Married    Spouse name: Not on file   Number of children: Not on file   Years of education: Not on file   Highest education level: Not on file  Occupational History   Not on file  Tobacco Use   Smoking status: Never Smoker   Smokeless tobacco: Never Used  Vaping Use   Vaping Use: Never used  Substance and Sexual Activity   Alcohol use: Yes    Comment: occassional    Drug use: No   Sexual activity: Not Currently  Other Topics Concern   Not on file  Social History Narrative   Not on file   Social Determinants of Health   Financial Resource Strain:    Difficulty of Paying Living Expenses: Not on file  Food Insecurity:    Worried About Running Out of  Food in the Last Year: Not on file   Ran Out of Food in the Last Year: Not on file  Transportation Needs:    Lack of Transportation (Medical): Not on file   Lack of Transportation (Non-Medical): Not on file  Physical Activity:    Days of Exercise per Week: Not on file   Minutes of  Exercise per Session: Not on file  Stress:    Feeling of Stress : Not on file  Social Connections:    Frequency of Communication with Friends and Family: Not on file   Frequency of Social Gatherings with Friends and Family: Not on file   Attends Religious Services: Not on file   Active Member of Clubs or Organizations: Not on file   Attends Banker Meetings: Not on file   Marital Status: Not on file  Intimate Partner Violence:    Fear of Current or Ex-Partner: Not on file   Emotionally Abused: Not on file   Physically Abused: Not on file   Sexually Abused: Not on file    No family history on file.   Current Outpatient Medications:    Calcium-Magnesium 300-300 MG TABS, Take 1 tablet by mouth daily., Disp: , Rfl:    celecoxib (CELEBREX) 100 MG capsule, Take 1 capsule (100 mg total) by mouth 2 (two) times daily., Disp: 60 capsule, Rfl: 2   cholecalciferol (VITAMIN D) 1000 units tablet, Take 1,000 Units by mouth daily. , Disp: , Rfl:    DULoxetine (CYMBALTA) 60 MG capsule, Take 1 capsule (60 mg total) by mouth daily., Disp: 30 capsule, Rfl: 2   escitalopram (LEXAPRO) 20 MG tablet, Take 20 mg by mouth daily., Disp: , Rfl:    folic acid (FOLVITE) 400 MCG tablet, Take 400 mcg by mouth daily., Disp: , Rfl:    GRALISE 600 MG TABS, Take 600 mg by mouth at bedtime. (Patient taking differently: Take 1,800 mg by mouth at bedtime. ), Disp: 30 tablet, Rfl: 2   Lutein 10 MG TABS, Take 10 mg by mouth daily., Disp: , Rfl:    Medium Chain Triglycerides (MCT OIL PO), Take 1 tablet by mouth daily., Disp: , Rfl:    Omega-3 Fatty Acids (OMEGA-3 FISH OIL PO), Take 1 capsule by mouth daily., Disp: , Rfl:    omeprazole (PRILOSEC) 20 MG capsule, Take 20 mg by mouth daily., Disp: , Rfl:    traMADol (ULTRAM) 50 MG tablet, Take 1 tablet (50 mg total) by mouth 2 (two) times daily., Disp: 60 tablet, Rfl: 2   traMADol (ULTRAM-ER) 200 MG 24 hr tablet, Take 200 mg by mouth daily.,  Disp: , Rfl:    Turmeric (QC TUMERIC COMPLEX PO), Take 1 tablet by mouth daily., Disp: , Rfl:   No results found.  No images are attached to the encounter.   CMP Latest Ref Rng & Units 10/25/2016  Glucose 65 - 99 mg/dL 96  BUN 6 - 20 mg/dL 20  Creatinine 3.81 - 8.29 mg/dL 9.37  Sodium 169 - 678 mmol/L 138  Potassium 3.5 - 5.1 mmol/L 4.1  Chloride 101 - 111 mmol/L 101  CO2 22 - 32 mmol/L 29  Calcium 8.9 - 10.3 mg/dL 9.3  Total Protein 6.5 - 8.1 g/dL 7.0  Total Bilirubin 0.3 - 1.2 mg/dL 0.6  Alkaline Phos 38 - 126 U/L 89  AST 15 - 41 U/L 23  ALT 14 - 54 U/L 20   CBC Latest Ref Rng & Units 02/04/2020  WBC 4.0 - 10.5 K/uL  4.9  Hemoglobin 12.0 - 15.0 g/dL 64.4  Hematocrit 36 - 46 % 43.0  Platelets 150 - 400 K/uL 103(L)     Observation/objective: Appears in no acute distress over video visit today.  Breathing is nonlabored  Assessment and plan: Patient is a 64 year old female with history of chronic thrombocytopenia likely secondary to ITP here for routine follow-up  Platelet counts have remained stable in the 100s over the last 6 months.  No bleeding or bruising.  She does not require any treatment for ITP at this time  Follow-up instructions: Repeat CBC with differential in 6 months in 1 year and I will see her back in 1 year  I discussed the assessment and treatment plan with the patient. The patient was provided an opportunity to ask questions and all were answered. The patient agreed with the plan and demonstrated an understanding of the instructions.   The patient was advised to call back or seek an in-person evaluation if the symptoms worsen or if the condition fails to improve as anticipated.   Visit Diagnosis: 1. Thrombocytopenia (HCC)     Dr. Owens Shark, MD, MPH [facility] at [facility] Tel- [phone] 02/06/2020 8:36 AM

## 2020-07-15 ENCOUNTER — Telehealth: Payer: Self-pay | Admitting: *Deleted

## 2020-07-15 NOTE — Telephone Encounter (Signed)
Got referral for this pt with thrombocytopenia. She is already a patient here. She has lab appt 2/25 and then another appt for labs and see md in Aug 2022. Dr. Smith Robert reviewed labs sent and said to keep appts that have already been scheduled for pt,. This info was sent in fax form and I did make sure that the fax went through

## 2020-08-07 ENCOUNTER — Inpatient Hospital Stay: Attending: Oncology

## 2020-08-07 DIAGNOSIS — Z79899 Other long term (current) drug therapy: Secondary | ICD-10-CM | POA: Insufficient documentation

## 2020-08-07 DIAGNOSIS — D696 Thrombocytopenia, unspecified: Secondary | ICD-10-CM | POA: Diagnosis not present

## 2020-08-07 LAB — CBC WITH DIFFERENTIAL/PLATELET
Abs Immature Granulocytes: 0.03 10*3/uL (ref 0.00–0.07)
Basophils Absolute: 0 10*3/uL (ref 0.0–0.1)
Basophils Relative: 0 %
Eosinophils Absolute: 0.1 10*3/uL (ref 0.0–0.5)
Eosinophils Relative: 1 %
HCT: 44.8 % (ref 36.0–46.0)
Hemoglobin: 14.8 g/dL (ref 12.0–15.0)
Immature Granulocytes: 0 %
Lymphocytes Relative: 18 %
Lymphs Abs: 1.2 10*3/uL (ref 0.7–4.0)
MCH: 30.6 pg (ref 26.0–34.0)
MCHC: 33 g/dL (ref 30.0–36.0)
MCV: 92.8 fL (ref 80.0–100.0)
Monocytes Absolute: 0.6 10*3/uL (ref 0.1–1.0)
Monocytes Relative: 9 %
Neutro Abs: 5 10*3/uL (ref 1.7–7.7)
Neutrophils Relative %: 72 %
Platelets: 142 10*3/uL — ABNORMAL LOW (ref 150–400)
RBC: 4.83 MIL/uL (ref 3.87–5.11)
RDW: 11.7 % (ref 11.5–15.5)
WBC: 7 10*3/uL (ref 4.0–10.5)
nRBC: 0 % (ref 0.0–0.2)

## 2020-09-02 ENCOUNTER — Other Ambulatory Visit
Admission: RE | Admit: 2020-09-02 | Discharge: 2020-09-02 | Disposition: A | Discharge status: Home / Self Care | Source: Ambulatory Visit | Attending: Gastroenterology | Admitting: Gastroenterology

## 2020-09-02 DIAGNOSIS — Z20822 Contact with and (suspected) exposure to covid-19: Secondary | ICD-10-CM | POA: Insufficient documentation

## 2020-09-02 DIAGNOSIS — Z01812 Encounter for preprocedural laboratory examination: Secondary | ICD-10-CM | POA: Diagnosis present

## 2020-09-03 ENCOUNTER — Encounter: Payer: Self-pay | Admitting: Emergency Medicine

## 2020-09-03 ENCOUNTER — Other Ambulatory Visit: Payer: Self-pay

## 2020-09-03 LAB — SARS CORONAVIRUS 2 (TAT 6-24 HRS): SARS Coronavirus 2: NEGATIVE

## 2020-09-04 ENCOUNTER — Encounter
Admission: RE | Disposition: A | Discharge status: Home / Self Care | Payer: Self-pay | Source: Home / Self Care | Attending: Gastroenterology

## 2020-09-04 ENCOUNTER — Encounter: Payer: Self-pay | Admitting: *Deleted

## 2020-09-04 ENCOUNTER — Ambulatory Visit: Admitting: Registered Nurse

## 2020-09-04 ENCOUNTER — Ambulatory Visit
Admission: RE | Admit: 2020-09-04 | Discharge: 2020-09-04 | Disposition: A | Discharge status: Home / Self Care | Attending: Gastroenterology | Admitting: Gastroenterology

## 2020-09-04 ENCOUNTER — Other Ambulatory Visit: Payer: Self-pay

## 2020-09-04 DIAGNOSIS — K5730 Diverticulosis of large intestine without perforation or abscess without bleeding: Secondary | ICD-10-CM | POA: Insufficient documentation

## 2020-09-04 DIAGNOSIS — R194 Change in bowel habit: Secondary | ICD-10-CM | POA: Insufficient documentation

## 2020-09-04 DIAGNOSIS — Z79899 Other long term (current) drug therapy: Secondary | ICD-10-CM | POA: Insufficient documentation

## 2020-09-04 DIAGNOSIS — R197 Diarrhea, unspecified: Secondary | ICD-10-CM | POA: Diagnosis not present

## 2020-09-04 DIAGNOSIS — K317 Polyp of stomach and duodenum: Secondary | ICD-10-CM | POA: Diagnosis not present

## 2020-09-04 DIAGNOSIS — D693 Immune thrombocytopenic purpura: Secondary | ICD-10-CM | POA: Insufficient documentation

## 2020-09-04 DIAGNOSIS — K5900 Constipation, unspecified: Secondary | ICD-10-CM | POA: Diagnosis not present

## 2020-09-04 DIAGNOSIS — K640 First degree hemorrhoids: Secondary | ICD-10-CM | POA: Insufficient documentation

## 2020-09-04 DIAGNOSIS — Z791 Long term (current) use of non-steroidal anti-inflammatories (NSAID): Secondary | ICD-10-CM | POA: Insufficient documentation

## 2020-09-04 DIAGNOSIS — Z79891 Long term (current) use of opiate analgesic: Secondary | ICD-10-CM | POA: Insufficient documentation

## 2020-09-04 DIAGNOSIS — K449 Diaphragmatic hernia without obstruction or gangrene: Secondary | ICD-10-CM | POA: Diagnosis not present

## 2020-09-04 DIAGNOSIS — K219 Gastro-esophageal reflux disease without esophagitis: Secondary | ICD-10-CM | POA: Insufficient documentation

## 2020-09-04 DIAGNOSIS — Z880 Allergy status to penicillin: Secondary | ICD-10-CM | POA: Insufficient documentation

## 2020-09-04 DIAGNOSIS — Z90710 Acquired absence of both cervix and uterus: Secondary | ICD-10-CM | POA: Insufficient documentation

## 2020-09-04 HISTORY — PX: ESOPHAGOGASTRODUODENOSCOPY (EGD) WITH PROPOFOL: SHX5813

## 2020-09-04 HISTORY — PX: COLONOSCOPY WITH PROPOFOL: SHX5780

## 2020-09-04 SURGERY — COLONOSCOPY WITH PROPOFOL
Anesthesia: General

## 2020-09-04 MED ORDER — PROPOFOL 500 MG/50ML IV EMUL
INTRAVENOUS | Status: DC | PRN
  Administered 2020-09-04: 150 ug/kg/min via INTRAVENOUS
  Administered 2020-09-04: 125 ug/kg/min via INTRAVENOUS

## 2020-09-04 MED ORDER — PROPOFOL 10 MG/ML IV BOLUS
INTRAVENOUS | Status: DC | PRN
  Administered 2020-09-04: 70 mg via INTRAVENOUS
  Administered 2020-09-04: 10 mg via INTRAVENOUS
  Administered 2020-09-04: 20 mg via INTRAVENOUS

## 2020-09-04 MED ORDER — SODIUM CHLORIDE 0.9 % IV SOLN: INTRAVENOUS | Status: DC

## 2020-09-04 MED ORDER — LIDOCAINE HCL (CARDIAC) PF 100 MG/5ML IV SOSY
PREFILLED_SYRINGE | INTRAVENOUS | Status: DC | PRN
  Administered 2020-09-04: 100 mg via INTRAVENOUS

## 2020-09-04 NOTE — Op Note (Signed)
[facility]enterology Patient Name: Sarah Morales Procedure Date: 09/04/2020 1:15 PM MRN: [id number] Account #: [id number] Date of Birth: 1956-05-18 Admit Type: Outpatient Age: 65 Room: [facility] ENDO ROOM 3 Gender: Female Note Status: Finalized Procedure:             Colonoscopy Indications:           Change in bowel habits, Constipation, Diarrhea Providers:             Andrey Farmer MD, MD Referring MD:          Laneta Simmers (Referring MD) Medicines:             Monitored Anesthesia Care Complications:         No immediate complications. Estimated blood loss:                         Minimal. Procedure:             Pre-Anesthesia Assessment:                        - Prior to the procedure, a History and Physical was                         performed, and patient medications and allergies were                         reviewed. The patient is competent. The risks and                         benefits of the procedure and the sedation options and                         risks were discussed with the patient. All questions                         were answered and informed consent was obtained.                         Patient identification and proposed procedure were                         verified by the physician, the nurse, the anesthetist                         and the technician in the endoscopy suite. Mental                         Status Examination: alert and oriented. Airway                         Examination: normal oropharyngeal airway and neck                         mobility. Respiratory Examination: clear to                         auscultation. CV Examination: normal. Prophylactic  Antibiotics: The patient does not require prophylactic                         antibiotics. Prior Anticoagulants: The patient has                         taken no previous anticoagulant or antiplatelet                         agents. ASA  Grade Assessment: III - A patient with                         severe systemic disease. After reviewing the risks and                         benefits, the patient was deemed in satisfactory                         condition to undergo the procedure. The anesthesia                         plan was to use monitored anesthesia care (MAC).                         Immediately prior to administration of medications,                         the patient was re-assessed for adequacy to receive                         sedatives. The heart rate, respiratory rate, oxygen                         saturations, blood pressure, adequacy of pulmonary                         ventilation, and response to care were monitored                         throughout the procedure. The physical status of the                         patient was re-assessed after the procedure.                        After obtaining informed consent, the colonoscope was                         passed under direct vision. Throughout the procedure,                         the patient's blood pressure, pulse, and oxygen                         saturations were monitored continuously. The                         Colonoscope was introduced through the anus and  advanced to the the cecum, identified by appendiceal                         orifice and ileocecal valve. The colonoscopy was                         performed without difficulty. The patient tolerated                         the procedure well. The quality of the bowel                         preparation was good. Findings:      The perianal and digital rectal examinations were normal.      A single small-mouthed diverticulum was found in the sigmoid colon.      Internal hemorrhoids were found during retroflexion. The hemorrhoids       were Grade I (internal hemorrhoids that do not prolapse).      Normal mucosa was found in the entire colon. Biopsies for  histology were       taken with a cold forceps from the entire colon for evaluation of       microscopic colitis.      The exam was otherwise without abnormality on direct and retroflexion       views. Impression:            - Diverticulosis in the sigmoid colon.                        - Internal hemorrhoids.                        - Normal mucosa in the entire examined colon. Biopsied.                        - The examination was otherwise normal on direct and                         retroflexion views. Recommendation:        - Discharge patient to home.                        - Resume previous diet.                        - Continue present medications.                        - Await pathology results.                        - Repeat colonoscopy in 10 years for screening                         purposes.                        - Return to referring physician as previously                         scheduled. Procedure Code(s):     --- Professional ---  45380, Colonoscopy, flexible; with biopsy, single or                         multiple Diagnosis Code(s):     --- Professional ---                        K64.0, First degree hemorrhoids                        R19.4, Change in bowel habit                        K59.00, Constipation, unspecified                        R19.7, Diarrhea, unspecified                        K57.30, Diverticulosis of large intestine without                         perforation or abscess without bleeding CPT copyright 2019 [employer]. All rights reserved. The codes documented in this report are preliminary and upon coder review may  be revised to meet current compliance requirements. Andrey Farmer MD, MD 09/04/2020 1:57:14 PM Number of Addenda: 0 Note Initiated On: 09/04/2020 1:15 PM Scope Withdrawal Time: 0 hours 12 minutes 27 seconds  Total Procedure Duration: 0 hours 20 minutes 21 seconds  Estimated Blood Loss:   Estimated blood loss was minimal.      [facility]

## 2020-09-04 NOTE — Op Note (Signed)
[facility] Patient Name: Sarah Morales Procedure Date: 09/04/2020 1:15 PM MRN: [id number] Account #: [id number] Date of Birth: 1956-02-26 Admit Type: Outpatient Age: 65 Room: [facility] ENDO ROOM 3 Gender: Female Note Status: Finalized Procedure:             Upper GI endoscopy Indications:           Gastro-esophageal reflux disease Providers:             Andrey Farmer MD, MD Referring MD:          Laneta Simmers (Referring MD) Medicines:             Monitored Anesthesia Care Complications:         No immediate complications. Estimated blood loss:                         Minimal. Procedure:             Pre-Anesthesia Assessment:                        - Prior to the procedure, a History and Physical was                         performed, and patient medications and allergies were                         reviewed. The patient is competent. The risks and                         benefits of the procedure and the sedation options and                         risks were discussed with the patient. All questions                         were answered and informed consent was obtained.                         Patient identification and proposed procedure were                         verified by the physician, the nurse, the anesthetist                         and the technician in the endoscopy suite. Mental                         Status Examination: alert and oriented. Airway                         Examination: normal oropharyngeal airway and neck                         mobility. Respiratory Examination: clear to                         auscultation. CV Examination: normal. Prophylactic                         Antibiotics:  The patient does not require prophylactic                         antibiotics. Prior Anticoagulants: The patient has                         taken no previous anticoagulant or antiplatelet                         agents. ASA Grade  Assessment: III - A patient with                         severe systemic disease. After reviewing the risks and                         benefits, the patient was deemed in satisfactory                         condition to undergo the procedure. The anesthesia                         plan was to use monitored anesthesia care (MAC).                         Immediately prior to administration of medications,                         the patient was re-assessed for adequacy to receive                         sedatives. The heart rate, respiratory rate, oxygen                         saturations, blood pressure, adequacy of pulmonary                         ventilation, and response to care were monitored                         throughout the procedure. The physical status of the                         patient was re-assessed after the procedure.                        After obtaining informed consent, the endoscope was                         passed under direct vision. Throughout the procedure,                         the patient's blood pressure, pulse, and oxygen                         saturations were monitored continuously. The Endoscope                         was introduced through the mouth, and advanced to the  second part of duodenum. The upper GI endoscopy was                         accomplished without difficulty. The patient tolerated                         the procedure well. Findings:      A small hiatal hernia was present.      Bilious fluid was found in the gastric body.      A single 1 mm sessile polyp with no stigmata of recent bleeding was       found in the gastric fundus. The polyp was removed with a cold biopsy       forceps. Resection and retrieval were complete. Estimated blood loss was       minimal.      The exam of the stomach was otherwise normal.      The examined duodenum was normal. Impression:            - Small hiatal hernia.                         - Bilious gastric fluid.                        - A single gastric polyp. Resected and retrieved.                        - Normal examined duodenum. Recommendation:        - Perform a colonoscopy today. Procedure Code(s):     --- Professional ---                        [phone], Esophagogastroduodenoscopy, flexible,                         transoral; with biopsy, single or multiple Diagnosis Code(s):     --- Professional ---                        K44.9, Diaphragmatic hernia without obstruction or                         gangrene                        K31.7, Polyp of stomach and duodenum                        K21.9, Gastro-esophageal reflux disease without                         esophagitis CPT copyright 2019 American Medical Association. All rights reserved. The codes documented in this report are preliminary and upon coder review may  be revised to meet current compliance requirements. Andrey Farmer MD, MD 09/04/2020 1:53:28 PM Number of Addenda: 0 Note Initiated On: 09/04/2020 1:15 PM Estimated Blood Loss:  Estimated blood loss was minimal.      [facility]

## 2020-09-04 NOTE — Transfer of Care (Signed)
Immediate Anesthesia Transfer of Care Note  Patient: Sarah Morales  Procedure(s) Performed: COLONOSCOPY WITH PROPOFOL (N/A ) ESOPHAGOGASTRODUODENOSCOPY (EGD) WITH PROPOFOL (N/A )  Patient Location: Endoscopy Unit  Anesthesia Type:General  Level of Consciousness: drowsy  Airway & Oxygen Therapy: Patient Spontanous Breathing  Post-op Assessment: Report given to RN and Post -op Vital signs reviewed and stable  Post vital signs: Reviewed and stable  Last Vitals:  Vitals Value Taken Time  BP 99/65 09/04/20 1352  Temp    Pulse 71 09/04/20 1353  Resp 14 09/04/20 1353  SpO2 97 % 09/04/20 1353  Vitals shown include unvalidated device data.  Last Pain:  Vitals:   09/04/20 1241  TempSrc: Temporal  PainSc: 0-No pain         Complications: No complications documented.

## 2020-09-04 NOTE — H&P (Signed)
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 History of surgeries for prolapse and history of hysterectomy. No blood thinners. No family history of GI malignancies. Last platelet count was 146.    Current Facility-Administered Medications:  .  0.9 %  sodium chloride infusion, , Intravenous, Continuous, Amadu Schlageter, Rossie Muskrat, MD, Last Rate: 20 mL/hr at 09/04/20 1302, New Bag at 09/04/20 1302  Medications Prior to Admission  Medication Sig Dispense Refill Last Dose  . gabapentin (NEURONTIN) 600 MG tablet Take 600 mg by mouth 3 (three) times daily.     . hydroxychloroquine (PLAQUENIL) 200 MG tablet Take by mouth daily.     . pantoprazole (PROTONIX) 40 MG tablet Take 40 mg by mouth daily.     . Calcium-Magnesium 300-300 MG TABS Take 1 tablet by mouth daily.     . celecoxib (CELEBREX) 100 MG capsule Take 1 capsule (100 mg total) by mouth 2 (two) times daily. 60 capsule 2   . cholecalciferol (VITAMIN D) 1000 units tablet Take 1,000 Units by mouth daily.      . DULoxetine (CYMBALTA) 60 MG capsule Take 1 capsule (60 mg total) by mouth daily. 30 capsule 2   . escitalopram (LEXAPRO) 20 MG tablet Take 20 mg by mouth daily.     . folic acid (FOLVITE) 400 MCG tablet Take 400 mcg by mouth daily.     Marland Kitchen GRALISE 600 MG TABS Take 600 mg by mouth at bedtime. (Patient taking differently: Take 1,800 mg by mouth at bedtime. ) 30 tablet 2   . Lutein 10 MG TABS Take 10 mg by mouth daily.     . Medium Chain Triglycerides (MCT OIL PO) Take 1 tablet by mouth daily.     . Omega-3 Fatty Acids (OMEGA-3 FISH OIL PO) Take 1 capsule by mouth daily.     Marland Kitchen omeprazole (PRILOSEC) 20 MG capsule Take 20 mg by mouth daily.     . traMADol (ULTRAM) 50 MG tablet  Take 1 tablet (50 mg total) by mouth 2 (two) times daily. 60 tablet 2   . traMADol (ULTRAM-ER) 200 MG 24 hr tablet Take 200 mg by mouth daily.     . Turmeric (QC TUMERIC COMPLEX PO) Take 1 tablet by mouth daily.        Allergies  Allergen Reactions  . Penicillins Rash     Past Medical History:  Diagnosis Date  . Arthritis   . Depression   . Fibromyalgia     Review of systems:  Otherwise negative.    Physical Exam  Gen: Alert, oriented. Appears stated age.  HEENT: PERRLA. Lungs: No respiratory distress CV: RRR Abd: soft, benign, no masses Ext: No edema    Planned procedures: Proceed with EGD/colonoscopy. The patient understands the nature of the planned procedure, indications, risks, alternatives and potential complications including but not limited to bleeding, infection, perforation, damage to internal organs and possible oversedation/side effects from anesthesia. The patient agrees and gives consent to proceed.  Please refer to procedure notes for findings, recommendations and patient disposition/instructions.     Merlyn Lot MD, MPH Gastroenterology 09/04/2020  1:11 PM

## 2020-09-04 NOTE — Anesthesia Postprocedure Evaluation (Signed)
Anesthesia Post Note  Patient: Sarah Morales  Procedure(s) Performed: COLONOSCOPY WITH PROPOFOL (N/A ) ESOPHAGOGASTRODUODENOSCOPY (EGD) WITH PROPOFOL (N/A )  Patient location during evaluation: Endoscopy Anesthesia Type: General Level of consciousness: awake and alert Pain management: pain level controlled Vital Signs Assessment: post-procedure vital signs reviewed and stable Respiratory status: spontaneous breathing, nonlabored ventilation, respiratory function stable and patient connected to nasal cannula oxygen Cardiovascular status: blood pressure returned to baseline and stable Postop Assessment: no apparent nausea or vomiting Anesthetic complications: no   No complications documented.   Last Vitals:  Vitals:   09/04/20 1350 09/04/20 1400  BP: 99/65 107/70  Pulse: 70   Resp: 20   Temp: 36.8 C   SpO2: 98%     Last Pain:  Vitals:   09/04/20 1400  TempSrc:   PainSc: 0-No pain                 Corinda Gubler

## 2020-09-04 NOTE — Interval H&P Note (Signed)
History and Physical Interval Note:  09/04/2020 1:14 PM  Sarah Morales  has presented today for surgery, with the diagnosis of CHANGE IN BOWEL HABITS GERD.  The various methods of treatment have been discussed with the patient and family. After consideration of risks, benefits and other options for treatment, the patient has consented to  Procedure(s) with comments: COLONOSCOPY WITH PROPOFOL (N/A) - C-19 TEST ON 09/03/2020 ESOPHAGOGASTRODUODENOSCOPY (EGD) WITH PROPOFOL (N/A) as a surgical intervention.  The patient's history has been reviewed, patient examined, no change in status, stable for surgery.  I have reviewed the patient's chart and labs.  Questions were answered to the patient's satisfaction.     Regis Bill  Ok to proceed with EGD/Colonoscopy

## 2020-09-04 NOTE — Anesthesia Preprocedure Evaluation (Signed)
Anesthesia Evaluation  Patient identified by MRN, date of birth, ID band Patient awake    Reviewed: Allergy & Precautions, H&P , NPO status , Patient's Chart, lab work & pertinent test results, reviewed documented beta blocker date and time   History of Anesthesia Complications Negative for: history of anesthetic complications  Airway Mallampati: I  TM Distance: >3 FB Neck ROM: full    Dental  (+) Dental Advidsory Given, Caps, Missing, Teeth Intact   Pulmonary neg pulmonary ROS,    Pulmonary exam normal breath sounds clear to auscultation       Cardiovascular Exercise Tolerance: Good negative cardio ROS Normal cardiovascular exam Rhythm:regular Rate:Normal     Neuro/Psych neg Seizures PSYCHIATRIC DISORDERS Depression  Neuromuscular disease (fibromyalgia)    GI/Hepatic Neg liver ROS, GERD  ,  Endo/Other  negative endocrine ROS  Renal/GU negative Renal ROS  negative genitourinary   Musculoskeletal   Abdominal   Peds  Hematology negative hematology ROS (+)   Anesthesia Other Findings Past Medical History: No date: Arthritis No date: Depression No date: Fibromyalgia   Reproductive/Obstetrics negative OB ROS                             Anesthesia Physical Anesthesia Plan  ASA: II  Anesthesia Plan: General   Post-op Pain Management:    Induction: Intravenous  PONV Risk Score and Plan: 3 and TIVA and Propofol infusion  Airway Management Planned: Natural Airway and Nasal Cannula  Additional Equipment:   Intra-op Plan:   Post-operative Plan:   Informed Consent: I have reviewed the patients History and Physical, chart, labs and discussed the procedure including the risks, benefits and alternatives for the proposed anesthesia with the patient or authorized representative who has indicated his/her understanding and acceptance.     Dental Advisory Given  Plan Discussed with:  Anesthesiologist, CRNA and Surgeon  Anesthesia Plan Comments:         Anesthesia Quick Evaluation

## 2020-09-06 ENCOUNTER — Encounter: Payer: Self-pay | Admitting: Gastroenterology

## 2020-09-07 LAB — SURGICAL PATHOLOGY

## 2020-11-14 ENCOUNTER — Emergency Department
Admission: EM | Admit: 2020-11-14 | Discharge: 2020-11-14 | Disposition: A | Discharge status: Home / Self Care | Attending: Emergency Medicine | Admitting: Emergency Medicine

## 2020-11-14 ENCOUNTER — Other Ambulatory Visit: Payer: Self-pay

## 2020-11-14 ENCOUNTER — Encounter: Payer: Self-pay | Admitting: Emergency Medicine

## 2020-11-14 DIAGNOSIS — R5383 Other fatigue: Secondary | ICD-10-CM | POA: Insufficient documentation

## 2020-11-14 DIAGNOSIS — R42 Dizziness and giddiness: Secondary | ICD-10-CM | POA: Diagnosis not present

## 2020-11-14 DIAGNOSIS — N390 Urinary tract infection, site not specified: Secondary | ICD-10-CM | POA: Diagnosis not present

## 2020-11-14 DIAGNOSIS — R300 Dysuria: Secondary | ICD-10-CM | POA: Diagnosis present

## 2020-11-14 LAB — URINALYSIS, COMPLETE (UACMP) WITH MICROSCOPIC
Bilirubin Urine: NEGATIVE
Glucose, UA: NEGATIVE mg/dL
Ketones, ur: NEGATIVE mg/dL
Nitrite: NEGATIVE
Protein, ur: 100 mg/dL — AB
RBC / HPF: >50 RBC/hpf — ABNORMAL HIGH (ref 0–5)
Specific Gravity, Urine: 1.016 (ref 1.005–1.030)
WBC, UA: >50 WBC/hpf — ABNORMAL HIGH (ref 0–5)
pH: 5 (ref 5.0–8.0)

## 2020-11-14 LAB — CBC
HCT: 40.4 % (ref 36.0–46.0)
Hemoglobin: 13.7 g/dL (ref 12.0–15.0)
MCH: 30.1 pg (ref 26.0–34.0)
MCHC: 33.9 g/dL (ref 30.0–36.0)
MCV: 88.8 fL (ref 80.0–100.0)
Platelets: 89 10*3/uL — ABNORMAL LOW (ref 150–400)
RBC: 4.55 MIL/uL (ref 3.87–5.11)
RDW: 12.7 % (ref 11.5–15.5)
WBC: 11.2 10*3/uL — ABNORMAL HIGH (ref 4.0–10.5)
nRBC: 0 % (ref 0.0–0.2)

## 2020-11-14 LAB — BASIC METABOLIC PANEL
Anion gap: 10 (ref 5–15)
BUN: 18 mg/dL (ref 8–23)
CO2: 25 mmol/L (ref 22–32)
Calcium: 8.9 mg/dL (ref 8.9–10.3)
Chloride: 103 mmol/L (ref 98–111)
Creatinine, Ser: 0.92 mg/dL (ref 0.44–1.00)
GFR, Estimated: >60 mL/min (ref >60)
Glucose, Bld: 160 mg/dL — ABNORMAL HIGH (ref 70–99)
Potassium: 3.5 mmol/L (ref 3.5–5.1)
Sodium: 138 mmol/L (ref 135–145)

## 2020-11-14 MED ORDER — SODIUM CHLORIDE 0.9 % IV SOLN
1000.0000 mL | Freq: Once | INTRAVENOUS | Status: AC
  Administered 2020-11-14: 1000 mL via INTRAVENOUS

## 2020-11-14 MED ORDER — CEPHALEXIN 500 MG PO CAPS: 500.0000 mg | ORAL_CAPSULE | Freq: Two times a day (BID) | ORAL | 0 refills | Status: AC

## 2020-11-14 MED ORDER — SODIUM CHLORIDE 0.9 % IV SOLN
1.0000 g | Freq: Once | INTRAVENOUS | Status: AC
  Administered 2020-11-14: 1 g via INTRAVENOUS
  Filled 2020-11-14: qty 10

## 2020-11-14 NOTE — ED Triage Notes (Signed)
Arrives from [facility] for ED evaluation for syncopal event last night at midnight.  Per report, patient blood pressure low, systolic 80's.  Patient is AAOx3.  Skin warm and dry. NAD

## 2020-11-14 NOTE — ED Notes (Signed)
Room temp inc from 67 to 70 degree at pt's request; given two warm blankets.

## 2020-11-14 NOTE — ED Provider Notes (Signed)
[facility] Emergency Department Provider Note   ____________________________________________    I have reviewed the triage vital signs and the nursing notes.   HISTORY  Chief Complaint Fatigue, weakness    HPI Sarah Morales is a 65 y.o. female who reports that over the last 2 to 3 weeks she has been having mild dysuria and symptoms of likely UTI.  Over the last several days she feels that she has been with increased fatigue some lightheadedness and suprapubic discomfort.  No nausea or vomiting.  Has not taken anything for this.  Did have chills on Monday.  No fevers today.  Past Medical History:  Diagnosis Date  . Arthritis   . Depression   . Fibromyalgia     Patient Active Problem List   Diagnosis Date Noted  . Chronic midline posterior neck pain (Bilateral) (R>L) 11/23/2016  . Cervical radiculitis (Bilateral) (R>L) 11/23/2016  . 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 Chronic pain syndrome 10/11/2016  . Long term (current) use of opiate analgesic 10/11/2016  . Long term prescription opiate use 10/11/2016  . Opiate use 10/11/2016  . Chronic upper extremity pain (Location of Primary Source of Pain) (Bilateral) (L>R) 10/11/2016  . Chronic low back pain (Location of Secondary source of pain) (Bilateral) (R>L) 10/11/2016  . Chronic shoulder pain (Location of Tertiary source of pain) (Bilateral) (R>L) 10/11/2016  . Chronic sacroiliac joint pain (Right) 10/11/2016  . Chronic hip pain (Bilateral) (R>L) 10/11/2016  . Lumbar facet syndrome (Bilateral) (R>L) 10/11/2016  . Depression 10/21/2015  . Recurrent UTI 09/02/2014  . Vaginal atrophy 09/02/2014  . Thrombocytopenia (HCC) 08/29/2013  . OAB (overactive bladder) 08/23/2013  . Fibromyalgia 11/20/2012    Past Surgical History:  Procedure Laterality Date  . ABDOMINAL HYSTERECTOMY    . BLADDER SURGERY    . CHOLECYSTECTOMY    . COLONOSCOPY WITH PROPOFOL N/A 09/04/2020    Procedure: COLONOSCOPY WITH PROPOFOL;  Surgeon: Regis Bill, MD;  Location: [facility] ENDOSCOPY;  Service: Endoscopy;  Laterality: N/A;  C-19 TEST ON 09/03/2020  . ESOPHAGOGASTRODUODENOSCOPY (EGD) WITH PROPOFOL N/A 09/04/2020   Procedure: ESOPHAGOGASTRODUODENOSCOPY (EGD) WITH PROPOFOL;  Surgeon: Regis Bill, MD;  Location: [facility] ENDOSCOPY;  Service: Endoscopy;  Laterality: N/A;  . prolapse colon      Prior to Admission medications   Medication Sig Start Date End Date Taking? Authorizing Provider  Calcium-Magnesium 300-300 MG TABS Take 1 tablet by mouth daily.    [provider]  celecoxib (CELEBREX) 100 MG capsule Take 1 capsule (100 mg total) by mouth 2 (two) times daily. 11/15/16 02/06/20  Delano Metz, MD  cholecalciferol (VITAMIN D) 1000 units tablet Take 1,000 Units by mouth daily.     [provider]  DULoxetine (CYMBALTA) 60 MG capsule Take 1 capsule (60 mg total) by mouth daily. 11/15/16 02/06/20  Delano Metz, MD  escitalopram (LEXAPRO) 20 MG tablet Take 20 mg by mouth daily.    [provider]  folic acid (FOLVITE) 400 MCG tablet Take 400 mcg by mouth daily.    [provider]  gabapentin (NEURONTIN) 600 MG tablet Take 600 mg by mouth 3 (three) times daily.    [provider]  GRALISE 600 MG TABS Take 600 mg by mouth at bedtime. Patient taking differently: Take 1,800 mg by mouth at bedtime.  11/15/16 02/06/20  Delano Metz, MD  hydroxychloroquine (PLAQUENIL) 200 MG tablet Take by mouth daily.    [provider]  Lutein 10 MG TABS Take 10  mg by mouth daily.    [provider]  Medium Chain Triglycerides (MCT OIL PO) Take 1 tablet by mouth daily.    [provider]  Omega-3 Fatty Acids (OMEGA-3 FISH OIL PO) Take 1 capsule by mouth daily.    [provider]  omeprazole (PRILOSEC) 20 MG capsule Take 20 mg by mouth daily.    [provider]  pantoprazole (PROTONIX) 40 MG tablet Take  40 mg by mouth daily.    [provider]  traMADol (ULTRAM) 50 MG tablet Take 1 tablet (50 mg total) by mouth 2 (two) times daily. 11/15/16 08/06/19  Delano Metz, MD  traMADol (ULTRAM-ER) 200 MG 24 hr tablet Take 200 mg by mouth daily.    [provider]  Turmeric (QC TUMERIC COMPLEX PO) Take 1 tablet by mouth daily.    [provider]     Allergies Penicillins  No family history on file.  Social History Social History   Tobacco Use  . Smoking status: Never Smoker  . Smokeless tobacco: Never Used  Vaping Use  . Vaping Use: Never used  Substance Use Topics  . Alcohol use: Yes    Comment: occassional   . Drug use: No    Review of Systems  Constitutional: No fever/chills Eyes: No visual changes.  ENT: No sore throat. Cardiovascular: Denies chest pain. Respiratory: Denies shortness of breath. Gastrointestinal: As above Genitourinary: As above Musculoskeletal: Negative for back pain. Skin: Negative for rash. Neurological: Negative for headaches   ____________________________________________   PHYSICAL EXAM:  VITAL SIGNS: ED Triage Vitals  Enc Vitals Group     BP 11/14/20 1038 93/67     Pulse Rate 11/14/20 1038 67     Resp 11/14/20 1038 17     Temp 11/14/20 1038 97.7 F (36.5 C)     Temp Source 11/14/20 1038 Oral     SpO2 11/14/20 1038 99 %     Weight 11/14/20 1038 74.8 kg (165 lb)     Height 11/14/20 1038 1.676 m (5\' 6" )     Head Circumference --      Peak Flow --      Pain Score 11/14/20 1041 0     Pain Loc --      Pain Edu? --      Excl. in GC? --     Constitutional: Alert and oriented.   Nose: No congestion/rhinnorhea. Mouth/Throat: Mucous membranes are dry Neck:  Painless ROM Cardiovascular: Normal rate, regular rhythm. Grossly normal heart sounds.  Good peripheral circulation. Respiratory: Normal respiratory effort.  No retractions. Gastrointestinal: Soft and nontender. No distention.  No CVA  tenderness.  Musculoskeletal: No lower extremity tenderness nor edema.  Warm and well perfused Neurologic:  Normal speech and language. No gross focal neurologic deficits are appreciated.  Skin:  Skin is warm, dry and intact. No rash noted. Psychiatric: Mood and affect are normal. Speech and behavior are normal.  ____________________________________________   LABS (all labs ordered are listed, but only abnormal results are displayed)  Labs Reviewed  BASIC METABOLIC PANEL - Abnormal; Notable for the following components:      Result Value   Glucose, Bld 160 (*)    All other components within normal limits  CBC - Abnormal; Notable for the following components:   WBC 11.2 (*)    Platelets 89 (*)    All other components within normal limits  URINALYSIS, COMPLETE (UACMP) WITH MICROSCOPIC - Abnormal; Notable for the following components:   Color, Urine AMBER (*)  APPearance TURBID (*)    Hgb urine dipstick SMALL (*)    Protein, ur 100 (*)    Leukocytes,Ua LARGE (*)    RBC / HPF >50 (*)    WBC, UA >50 (*)    Bacteria, UA MANY (*)    Non Squamous Epithelial PRESENT (*)    All other components within normal limits  CBG MONITORING, ED   ____________________________________________  EKG  ED ECG REPORT I, Jene Every, the attending physician, personally viewed and interpreted this ECG.  Date: 11/14/2020  Rhythm: normal sinus rhythm QRS Axis: normal Intervals: normal ST/T Wave abnormalities: normal Narrative Interpretation: no evidence of acute ischemia  ____________________________________________  RADIOLOGY  None ____________________________________________   PROCEDURES  Procedure(s) performed: No  Procedures   Critical Care performed: No ____________________________________________   INITIAL IMPRESSION / ASSESSMENT AND PLAN / ED COURSE  Pertinent labs & imaging results that were available during my care of the patient were reviewed by me and considered  in my medical decision making (see chart for details).  Patient presents with symptoms as noted above, urinalysis is consistent with urinary tract infection, mild elevation of white blood cell count.  However heart rate is normal, blood pressure is normal.  Patient is afebrile.  Will treat with a dose of IV Rocephin, IV fluids given that she clinically she appears mildly dehydrated.  Anticipate discharge    ____________________________________________   FINAL CLINICAL IMPRESSION(S) / ED DIAGNOSES  Final diagnoses:  Acute lower urinary tract infection        Note:  This document was prepared using Dragon voice recognition software and may include unintentional dictation errors.   Jene Every, MD 11/14/20 [phone]

## 2021-01-18 ENCOUNTER — Other Ambulatory Visit: Payer: Self-pay | Admitting: Family

## 2021-01-18 DIAGNOSIS — Z1231 Encounter for screening mammogram for malignant neoplasm of breast: Secondary | ICD-10-CM

## 2021-02-01 ENCOUNTER — Encounter

## 2021-02-05 ENCOUNTER — Ambulatory Visit: Admitting: Oncology

## 2021-02-05 ENCOUNTER — Other Ambulatory Visit

## 2021-02-12 ENCOUNTER — Other Ambulatory Visit: Payer: Self-pay

## 2021-02-12 ENCOUNTER — Ambulatory Visit
Admission: RE | Admit: 2021-02-12 | Discharge: 2021-02-12 | Disposition: A | Discharge status: Home / Self Care | Source: Ambulatory Visit | Attending: Family | Admitting: Family

## 2021-02-12 DIAGNOSIS — Z1231 Encounter for screening mammogram for malignant neoplasm of breast: Secondary | ICD-10-CM | POA: Insufficient documentation

## 2021-02-18 ENCOUNTER — Inpatient Hospital Stay
Admission: RE | Admit: 2021-02-18 | Discharge: 2021-02-18 | Disposition: A | Discharge status: Home / Self Care | Payer: Self-pay | Source: Ambulatory Visit | Attending: *Deleted | Admitting: *Deleted

## 2021-02-18 ENCOUNTER — Other Ambulatory Visit: Payer: Self-pay | Admitting: *Deleted

## 2021-02-18 DIAGNOSIS — Z1231 Encounter for screening mammogram for malignant neoplasm of breast: Secondary | ICD-10-CM

## 2021-02-19 ENCOUNTER — Inpatient Hospital Stay: Attending: Oncology

## 2021-02-19 ENCOUNTER — Other Ambulatory Visit: Payer: Self-pay | Admitting: *Deleted

## 2021-02-19 DIAGNOSIS — D696 Thrombocytopenia, unspecified: Secondary | ICD-10-CM | POA: Insufficient documentation

## 2021-02-19 DIAGNOSIS — D693 Immune thrombocytopenic purpura: Secondary | ICD-10-CM

## 2021-02-19 LAB — CBC WITH DIFFERENTIAL/PLATELET
Abs Immature Granulocytes: 0.01 10*3/uL (ref 0.00–0.07)
Basophils Absolute: 0 10*3/uL (ref 0.0–0.1)
Basophils Relative: 0 %
Eosinophils Absolute: 0.1 10*3/uL (ref 0.0–0.5)
Eosinophils Relative: 1 %
HCT: 45.1 % (ref 36.0–46.0)
Hemoglobin: 15.2 g/dL — ABNORMAL HIGH (ref 12.0–15.0)
Immature Granulocytes: 0 %
Lymphocytes Relative: 23 %
Lymphs Abs: 1.3 10*3/uL (ref 0.7–4.0)
MCH: 30.5 pg (ref 26.0–34.0)
MCHC: 33.7 g/dL (ref 30.0–36.0)
MCV: 90.4 fL (ref 80.0–100.0)
Monocytes Absolute: 0.6 10*3/uL (ref 0.1–1.0)
Monocytes Relative: 10 %
Neutro Abs: 3.7 10*3/uL (ref 1.7–7.7)
Neutrophils Relative %: 66 %
Platelets: 106 10*3/uL — ABNORMAL LOW (ref 150–400)
RBC: 4.99 MIL/uL (ref 3.87–5.11)
RDW: 12.4 % (ref 11.5–15.5)
WBC: 5.8 10*3/uL (ref 4.0–10.5)
nRBC: 0 % (ref 0.0–0.2)

## 2021-02-20 ENCOUNTER — Other Ambulatory Visit: Payer: Self-pay | Admitting: *Deleted

## 2021-02-20 DIAGNOSIS — D693 Immune thrombocytopenic purpura: Secondary | ICD-10-CM

## 2021-02-22 ENCOUNTER — Inpatient Hospital Stay: Admitting: Oncology

## 2021-02-22 ENCOUNTER — Inpatient Hospital Stay

## 2022-08-29 ENCOUNTER — Ambulatory Visit: Admitting: Dietician

## 2023-04-11 ENCOUNTER — Other Ambulatory Visit: Payer: Self-pay | Admitting: Family

## 2023-04-11 DIAGNOSIS — Z1231 Encounter for screening mammogram for malignant neoplasm of breast: Secondary | ICD-10-CM

## 2023-04-26 ENCOUNTER — Encounter

## 2023-05-18 ENCOUNTER — Ambulatory Visit
Admission: RE | Admit: 2023-05-18 | Discharge: 2023-05-18 | Disposition: A | Discharge status: Home / Self Care | Payer: Medicare Other | Source: Ambulatory Visit | Attending: Family | Admitting: Family

## 2023-05-18 DIAGNOSIS — Z1231 Encounter for screening mammogram for malignant neoplasm of breast: Secondary | ICD-10-CM | POA: Diagnosis present

## 2023-05-23 IMAGING — MG MM DIGITAL SCREENING BILAT W/ TOMO AND CAD
8 series · 8 of 24 positions shown · non-contrast
Comparison: Previous exam(s).

CLINICAL DATA: Screening.

EXAM:
DIGITAL SCREENING BILATERAL MAMMOGRAM WITH TOMOSYNTHESIS AND CAD
TECHNIQUE: Bilateral screening digital craniocaudal and mediolateral oblique
mammograms were obtained. Bilateral screening digital breast
tomosynthesis was performed. The images were evaluated with
computer-aided detection.

[R CC synth-2D]
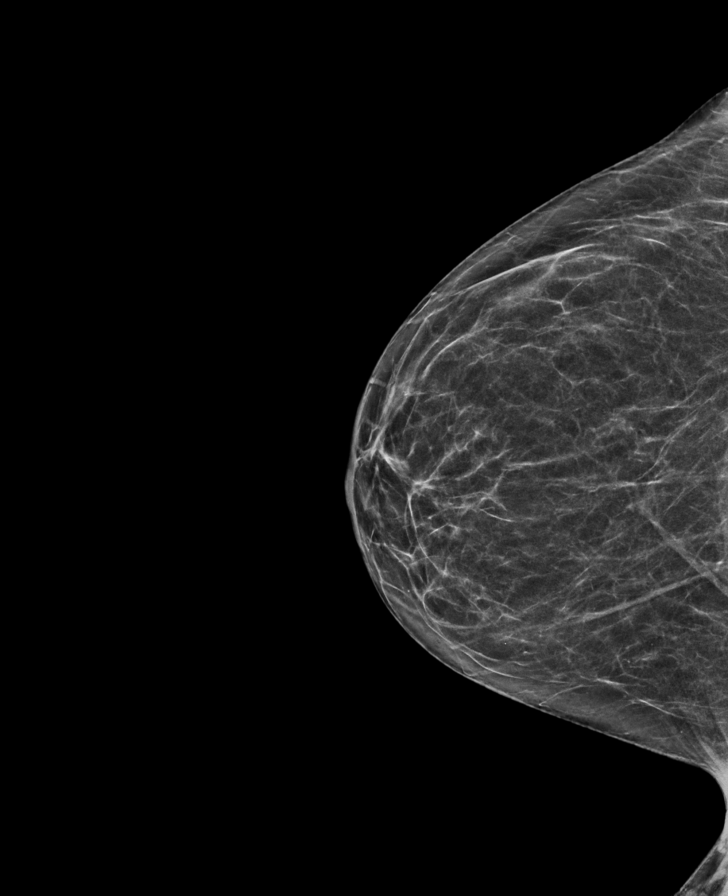

[L MLO synth-2D]
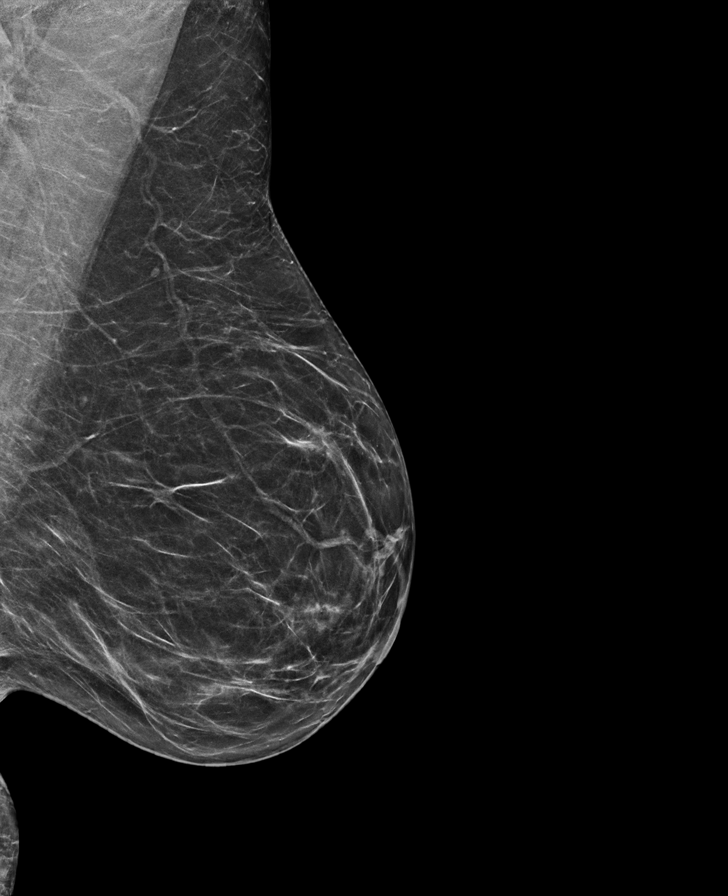

[L CC synth-2D]
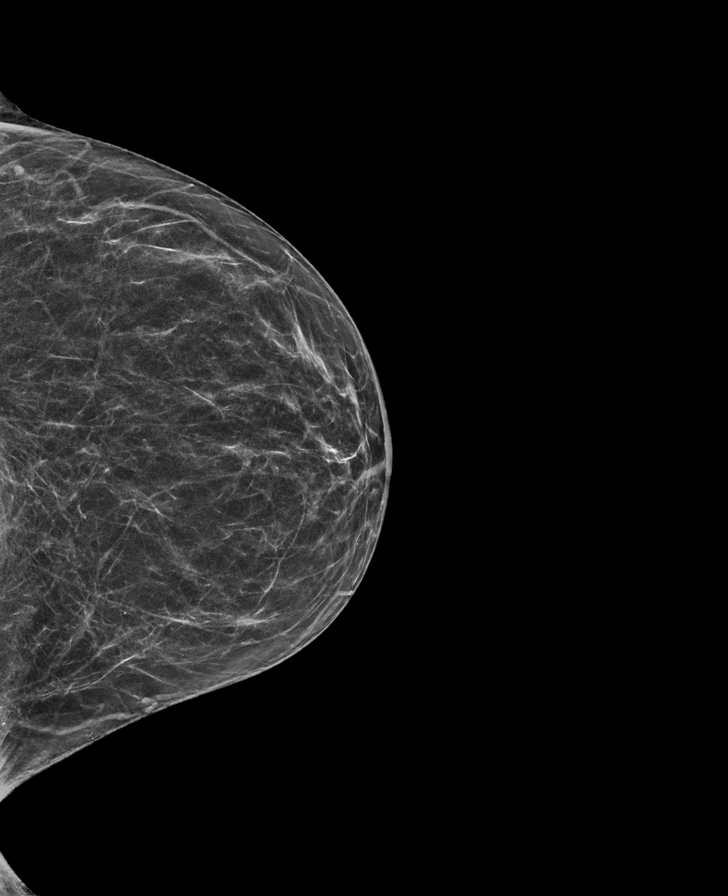

[R MLO synth-2D]
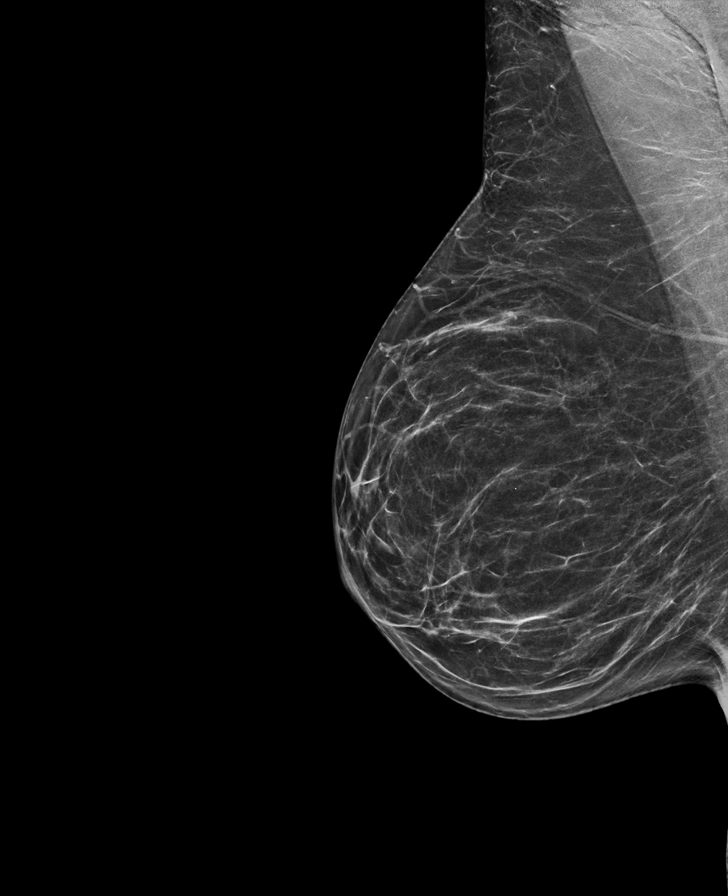

[R MLO tomo · tomo slice 31/61.0]
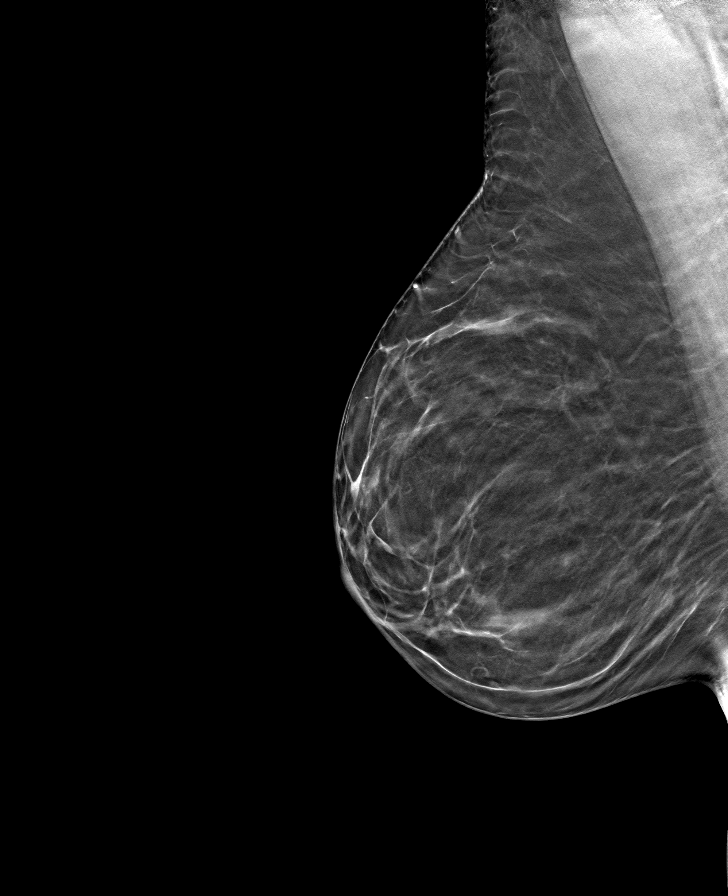

[L CC tomo · tomo slice 28/55.0]
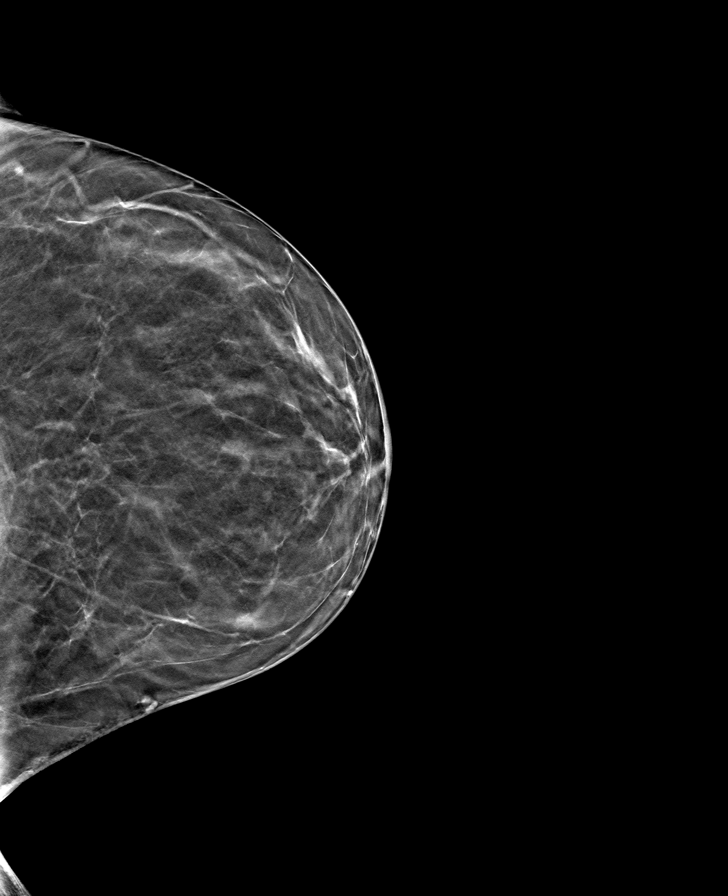

[R CC tomo · tomo slice 29/56.0]
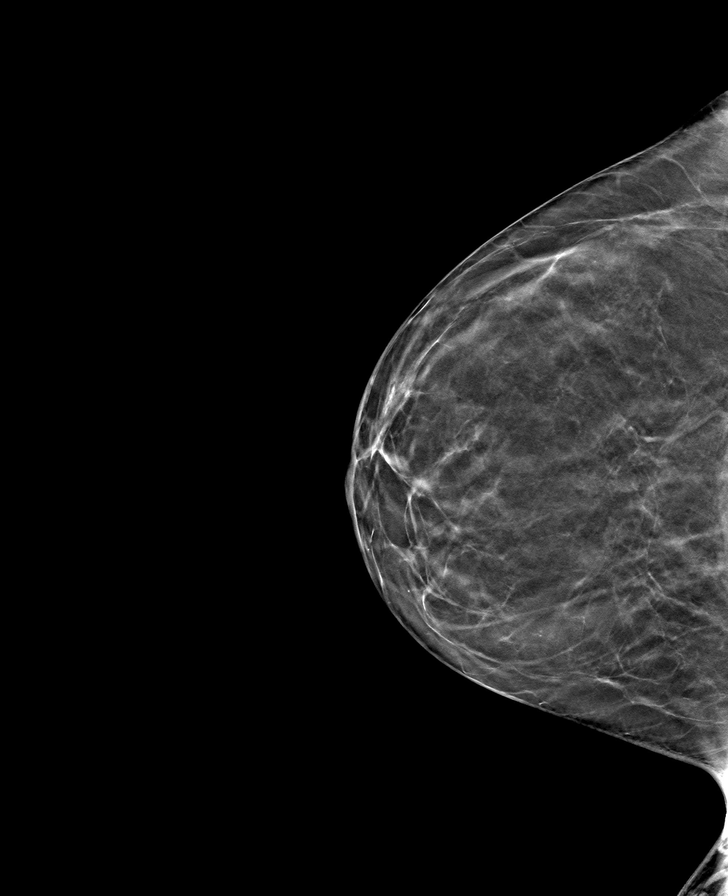

[L MLO tomo · tomo slice 31/62.0]
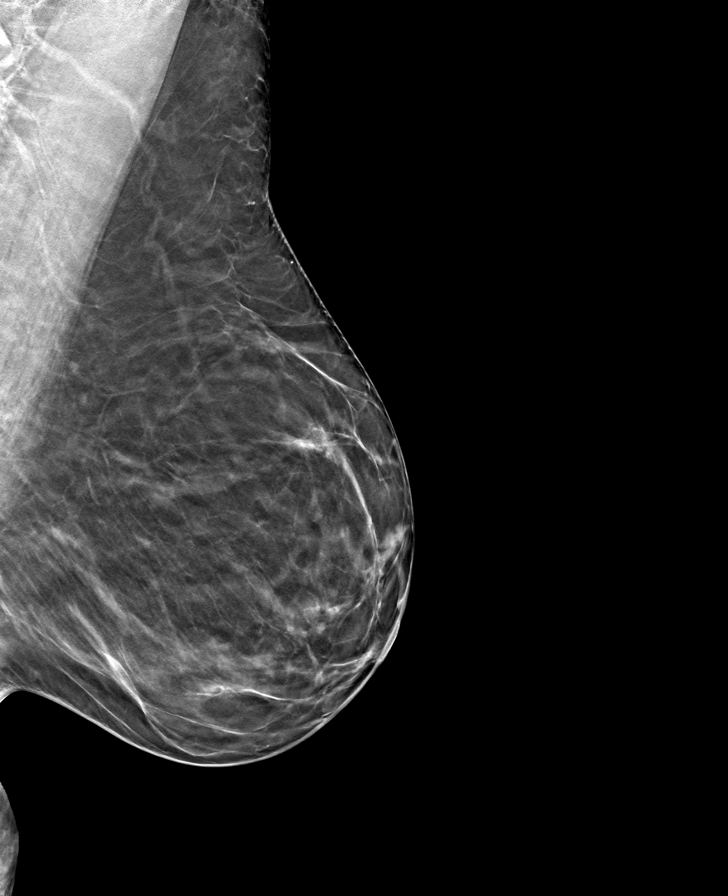

[8 of 24 positions shown; findings below may reference images not displayed]

ACR Breast Density Category b: There are scattered areas of
fibroglandular density.
FINDINGS: There are no findings suspicious for malignancy.
IMPRESSION: No mammographic evidence of malignancy. A result letter of this
screening mammogram will be mailed directly to the patient.

RECOMMENDATION:
Screening mammogram in one year. (Code:51-O-LD2)

BI-RADS CATEGORY  1: Negative.

## 2024-05-06 ENCOUNTER — Other Ambulatory Visit: Payer: Self-pay | Admitting: Family

## 2024-05-06 DIAGNOSIS — Z1231 Encounter for screening mammogram for malignant neoplasm of breast: Secondary | ICD-10-CM

## 2024-06-11 ENCOUNTER — Other Ambulatory Visit: Payer: Self-pay | Admitting: Family Medicine

## 2024-06-11 DIAGNOSIS — M5412 Radiculopathy, cervical region: Secondary | ICD-10-CM

## 2024-06-11 DIAGNOSIS — M5416 Radiculopathy, lumbar region: Secondary | ICD-10-CM

## 2024-06-19 ENCOUNTER — Encounter

## 2024-06-22 ENCOUNTER — Ambulatory Visit
Admission: RE | Admit: 2024-06-22 | Discharge: 2024-06-22 | Disposition: A | Source: Ambulatory Visit | Attending: Family Medicine | Admitting: Family Medicine

## 2024-06-22 ENCOUNTER — Inpatient Hospital Stay
Admission: RE | Admit: 2024-06-22 | Discharge: 2024-06-22 | Disposition: A | Source: Ambulatory Visit | Attending: Family Medicine | Admitting: Family Medicine

## 2024-06-22 DIAGNOSIS — M5412 Radiculopathy, cervical region: Secondary | ICD-10-CM

## 2024-06-22 DIAGNOSIS — M5416 Radiculopathy, lumbar region: Secondary | ICD-10-CM

## 2024-07-04 ENCOUNTER — Ambulatory Visit
Admission: RE | Admit: 2024-07-04 | Discharge: 2024-07-04 | Disposition: A | Discharge status: Home / Self Care | Source: Ambulatory Visit | Attending: Family | Admitting: Family

## 2024-07-04 DIAGNOSIS — Z1231 Encounter for screening mammogram for malignant neoplasm of breast: Secondary | ICD-10-CM | POA: Insufficient documentation
